# Patient Record
Sex: Female | Born: 1985 | Race: Black or African American | Hispanic: No | Marital: Single | Smoking: Current some day smoker
Health system: Southern US, Community
[De-identification: ages and names within clinical notes are randomized; demographics above are authoritative.]

## PROBLEM LIST (undated history)

## (undated) DIAGNOSIS — F329 Major depressive disorder, single episode, unspecified: Secondary | ICD-10-CM

## (undated) DIAGNOSIS — I219 Acute myocardial infarction, unspecified: Secondary | ICD-10-CM

## (undated) DIAGNOSIS — F32A Depression, unspecified: Secondary | ICD-10-CM

## (undated) DIAGNOSIS — I10 Essential (primary) hypertension: Secondary | ICD-10-CM

## (undated) DIAGNOSIS — E119 Type 2 diabetes mellitus without complications: Secondary | ICD-10-CM

## (undated) HISTORY — PX: APPENDECTOMY: SHX54

---
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
## 2006-12-02 ENCOUNTER — Ambulatory Visit: Payer: Self-pay | Admitting: Internal Medicine

## 2007-11-02 ENCOUNTER — Ambulatory Visit: Payer: Self-pay | Admitting: Internal Medicine

## 2007-11-02 DIAGNOSIS — R21 Rash and other nonspecific skin eruption: Secondary | ICD-10-CM

## 2011-09-02 ENCOUNTER — Encounter (HOSPITAL_COMMUNITY): Payer: Self-pay | Admitting: Emergency Medicine

## 2011-09-02 ENCOUNTER — Emergency Department (HOSPITAL_COMMUNITY)
Admission: EM | Admit: 2011-09-02 | Discharge: 2011-09-02 | Disposition: A | Discharge status: Home / Self Care | Payer: Self-pay | Attending: Emergency Medicine | Admitting: Emergency Medicine

## 2011-09-02 DIAGNOSIS — K047 Periapical abscess without sinus: Secondary | ICD-10-CM | POA: Insufficient documentation

## 2011-09-02 DIAGNOSIS — K0889 Other specified disorders of teeth and supporting structures: Secondary | ICD-10-CM

## 2011-09-02 MED ORDER — PENICILLIN V POTASSIUM 500 MG PO TABS: 500.0000 mg | ORAL_TABLET | Freq: Three times a day (TID) | ORAL | Status: AC

## 2011-09-02 MED ORDER — OXYCODONE-ACETAMINOPHEN 5-325 MG PO TABS: 2.0000 | ORAL_TABLET | ORAL | Status: AC | PRN

## 2011-09-02 NOTE — ED Provider Notes (Signed)
History     CSN: [id number]  Arrival date & time 09/02/11  1444   First MD Initiated Contact with Patient 09/02/11 1454      No chief complaint on file.   (Consider location/radiation/quality/duration/timing/severity/associated sxs/prior treatment) Patient is a 26 y.o. female presenting with tooth pain. The history is provided by the patient. No language interpreter was used.  Dental PainThe primary symptoms include mouth pain. Primary symptoms do not include oral bleeding, headaches, fever, shortness of breath or sore throat. The symptoms began more than 1 week ago. The symptoms are worsening. The symptoms are chronic. The symptoms occur constantly.  Additional symptoms include: gum swelling and gum tenderness. Additional symptoms do not include: purulent gums, trismus, jaw pain, facial swelling, trouble swallowing and ear pain. Medical issues do not include: alcohol problem, smoking, chewing tobacco, immunosuppression, periodontal disease and cancer.   Patient reports intermittent dental pain x2 weeks. States that there is some swelling and pain to the tooth decider wisdom tooth on the right top. States there is pain to her gum area on the lower right molar also. States that she was taking an exam today and needed to come to the ER because the pain was so bad. No past medical history for appendectomy.   History reviewed. No pertinent past medical history.  Past Surgical History  Procedure Date  . Appendectomy     No family history on file.  History  Substance Use Topics  . Smoking status: Never Smoker   . Smokeless tobacco: Not on file  . Alcohol Use: No    OB History    Grav Para Term Preterm Abortions TAB SAB Ect Mult Living                  Review of Systems  Unable to perform ROS Constitutional: Negative.  Negative for fever.  HENT: Positive for dental problem. Negative for ear pain, sore throat, facial swelling, trouble swallowing and voice change.   Eyes:  Negative.   Respiratory: Negative.  Negative for shortness of breath.   Cardiovascular: Negative.   Gastrointestinal: Negative.  Negative for nausea and vomiting.  Neurological: Negative.  Negative for headaches.  Psychiatric/Behavioral: Negative.   All other systems reviewed and are negative.    Allergies  Review of patient's allergies indicates no known allergies.  Home Medications  No current outpatient prescriptions on file.  BP 138/87  Pulse 92  Temp 98.8 F (37.1 C)  Resp 22  SpO2 99%  LMP 08/05/2011  Physical Exam  Nursing note and vitals reviewed. Constitutional: She is oriented to person, place, and time. She appears well-developed and well-nourished.  HENT:  Head: Normocephalic and atraumatic.  Mouth/Throat: Uvula is midline, oropharynx is clear and moist and mucous membranes are normal. Abnormal dentition. Dental abscesses present.    Eyes: Conjunctivae and EOM are normal. Pupils are equal, round, and reactive to light.  Neck: Normal range of motion. Neck supple.  Cardiovascular: Normal rate, regular rhythm, normal heart sounds and intact distal pulses.  Exam reveals no gallop and no friction rub.   No murmur heard. Pulmonary/Chest: Effort normal and breath sounds normal.  Abdominal: Soft. Bowel sounds are normal.  Musculoskeletal: Normal range of motion. She exhibits no edema and no tenderness.  Neurological: She is alert and oriented to person, place, and time. She has normal reflexes.  Skin: Skin is warm and dry.  Psychiatric: She has a normal mood and affect.    ED Course  Procedures (including critical care time)  Labs Reviewed - No data to display No results found.   No diagnosis found.    MDM  R upper molar and R lower Molar pain x 2 weeks with gum swelling to RU treated with rx for penicillin and percocet  With dental follow up tomorrow.  No fever or facial swelling.          Jethro Bastos, NP 09/03/11 [phone]

## 2011-09-02 NOTE — Discharge Instructions (Signed)
Ms Allcorn called the dentist on call for the ER listed below. Call within  24 hours and he will see you. Start antibiotics today. Take ibuprofen for pain with food. Take Percocet for severe pain do not drive with it. Turned to the ER for severe pain or any other medical concerns  Dental Pain Toothache is pain in or around a tooth. It may get worse with chewing or with cold or heat.  HOME CARE  Your dentist may use a numbing medicine during treatment. If so, you may need to avoid eating until the medicine wears off. Ask your dentist about this.   Only take medicine as told by your dentist or doctor.   Avoid chewing food near the painful tooth until after all treatment is done. Ask your dentist about this.  GET HELP RIGHT AWAY IF:   The problem gets worse or new problems appear.   You have a fever.   There is redness and puffiness (swelling) of the face, jaw, or neck.   You cannot open your mouth.   There is pain in the jaw.   There is very bad pain that is not helped by medicine.  MAKE SURE YOU:   Understand these instructions.   Will watch your condition.   Will get help right away if you are not doing well or get worse.  Document Released: 11/11/2007 Document Revised: 05/14/2011 Document Reviewed: 11/11/2007 [facility] Patient Information 2012 [place], [place].Dental Pain Toothache is pain in or around a tooth. It may get worse with chewing or with cold or heat.  HOME CARE  Your dentist may use a numbing medicine during treatment. If so, you may need to avoid eating until the medicine wears off. Ask your dentist about this.   Only take medicine as told by your dentist or doctor.   Avoid chewing food near the painful tooth until after all treatment is done. Ask your dentist about this.  GET HELP RIGHT AWAY IF:   The problem gets worse or new problems appear.   You have a fever.   There is redness and puffiness (swelling) of the face, jaw, or neck.   You cannot open your  mouth.   There is pain in the jaw.   There is very bad pain that is not helped by medicine.  MAKE SURE YOU:   Understand these instructions.   Will watch your condition.   Will get help right away if you are not doing well or get worse.  Document Released: 11/11/2007 Document Revised: 05/14/2011 Document Reviewed: 11/11/2007 [facility] Patient Information 2012 [place], [place].Dental Pain Toothache is pain in or around a tooth. It may get worse with chewing or with cold or heat.  HOME CARE  Your dentist may use a numbing medicine during treatment. If so, you may need to avoid eating until the medicine wears off. Ask your dentist about this.   Only take medicine as told by your dentist or doctor.   Avoid chewing food near the painful tooth until after all treatment is done. Ask your dentist about this.  GET HELP RIGHT AWAY IF:   The problem gets worse or new problems appear.   You have a fever.   There is redness and puffiness (swelling) of the face, jaw, or neck.   You cannot open your mouth.   There is pain in the jaw.   There is very bad pain that is not helped by medicine.  MAKE SURE YOU:   Understand these instructions.  Will watch your condition.   Will get help right away if you are not doing well or get worse.  Document Released: 11/11/2007 Document Revised: 05/14/2011 Document Reviewed: 11/11/2007 [facility] Patient Information 2012 [place], [place].Dental Care and Dentist Visits Dental care supports good overall health. Regular dental visits can also help you avoid dental pain, bleeding, infection, and other more serious health problems in the future. It is important to keep the mouth healthy because diseases in the teeth, gums, and other oral tissues can spread to other areas of the body. Some problems, such as diabetes, heart disease, and pre-term labor have been associated with poor oral health.  See your dentist every 6 months. If you experience emergency problems  such as a toothache or broken tooth, go to the dentist right away. If you see your dentist regularly, you may catch problems early. It is easier to be treated for problems in the early stages.  WHAT TO EXPECT AT A DENTIST VISIT  Your dentist will look for many common oral health problems and recommend proper treatment. At your regular dental visit, you can expect:  Gentle cleaning of the teeth and gums. This includes scraping and polishing. This helps to remove the sticky substance around the teeth and gums (plaque). Plaque forms in the mouth shortly after eating. Over time, plaque hardens on the teeth as tartar. If tartar is not removed regularly, it can cause problems. Cleaning also helps remove stains.   Periodic X-rays. These pictures of the teeth and supporting bone will help your dentist assess the health of your teeth.   Periodic fluoride treatments. Fluoride is a natural mineral shown to help strengthen teeth. Fluoride treatmentinvolves applying a fluoride gel or varnish to the teeth. It is most commonly done in children.   Examination of the mouth, tongue, jaws, teeth, and gums to look for any oral health problems, such as:   Cavities (dental caries). This is decay on the tooth caused by plaque, sugar, and acid in the mouth. It is best to catch a cavity when it is small.   Inflammation of the gums caused by plaque buildup (gingivitis).   Problems with the mouth or malformed or misaligned teeth.   Oral cancer or other diseases of the soft tissues or jaws.  KEEP YOUR TEETH AND GUMS HEALTHY For healthy teeth and gums, follow these general guidelines as well as your dentist's specific advice:  Have your teeth professionally cleaned at the dentist every 6 months.   Brush twice daily with a fluoride toothpaste.   Floss your teeth daily.   Ask your dentist if you need fluoride supplements, treatments, or fluoride toothpaste.   Eat a healthy diet. Reduce foods and drinks with added  sugar.   Avoid smoking.  TREATMENT FOR ORAL HEALTH PROBLEMS If you have oral health problems, treatment varies depending on the conditions present in your teeth and gums.  Your caregiver will most likely recommend good oral hygiene at each visit.   For cavities, gingivitis, or other oral health disease, your caregiver will perform a procedure to treat the problem. This is typically done at a separate appointment. Sometimes your caregiver will refer you to another dental specialist for specific tooth problems or for surgery.  SEEK IMMEDIATE DENTAL CARE IF:  You have pain, bleeding, or soreness in the gum, tooth, jaw, or mouth area.   A permanent tooth becomes loose or separated from the gum socket.   You experience a blow or injury to the mouth or jaw  area.  Document Released: 02/04/2011 Document Revised: 05/14/2011 Document Reviewed: 02/04/2011 [facility] Patient Information 2012 [place], [place].

## 2011-09-02 NOTE — ED Notes (Signed)
Pt reports that she has toothache to right upper teeth with swelling and pain 8/10. Denies fever.

## 2011-09-03 NOTE — ED Provider Notes (Signed)
Medical screening examination/treatment/procedure(s) were performed by non-physician practitioner and as supervising physician I was immediately available for consultation/collaboration.   Daytona Hedman, MD 09/03/11 1804 

## 2011-09-17 ENCOUNTER — Ambulatory Visit: Payer: Self-pay | Admitting: Family Medicine

## 2011-09-19 ENCOUNTER — Ambulatory Visit (INDEPENDENT_AMBULATORY_CARE_PROVIDER_SITE_OTHER): Payer: Self-pay | Admitting: Emergency Medicine

## 2011-09-19 VITALS — BP 121/80 | HR 76 | Temp 97.7°F | Resp 16 | Ht 63.0 in | Wt 174.0 lb

## 2011-09-19 DIAGNOSIS — Z139 Encounter for screening, unspecified: Secondary | ICD-10-CM

## 2011-09-19 NOTE — Progress Notes (Signed)
  Subjective:    Patient ID: Christy Rogers, female    DOB: 24-Jan-1986, 26 y.o.   MRN: [id number]  HPI    Review of Systems     Objective:   Physical Exam        Assessment & Plan:   PPD applied

## 2011-09-21 ENCOUNTER — Ambulatory Visit (INDEPENDENT_AMBULATORY_CARE_PROVIDER_SITE_OTHER): Payer: Self-pay

## 2011-09-21 DIAGNOSIS — Z139 Encounter for screening, unspecified: Secondary | ICD-10-CM

## 2011-09-21 LAB — TB SKIN TEST: Induration: 0

## 2012-01-04 ENCOUNTER — Emergency Department (HOSPITAL_COMMUNITY)
Admission: EM | Admit: 2012-01-04 | Discharge: 2012-01-04 | Disposition: A | Discharge status: Home / Self Care | Payer: Federal, State, Local not specified - PPO | Attending: Emergency Medicine | Admitting: Emergency Medicine

## 2012-01-04 ENCOUNTER — Encounter (HOSPITAL_COMMUNITY): Payer: Self-pay | Admitting: *Deleted

## 2012-01-04 ENCOUNTER — Emergency Department (HOSPITAL_COMMUNITY): Payer: Federal, State, Local not specified - PPO

## 2012-01-04 DIAGNOSIS — R079 Chest pain, unspecified: Secondary | ICD-10-CM | POA: Insufficient documentation

## 2012-01-04 DIAGNOSIS — Z9089 Acquired absence of other organs: Secondary | ICD-10-CM | POA: Insufficient documentation

## 2012-01-04 NOTE — ED Notes (Signed)
Pt reports left sided chest tightness with no associated symptoms.

## 2012-01-04 NOTE — ED Provider Notes (Signed)
History     CSN: [id number]  Arrival date & time 01/04/12  1845   First MD Initiated Contact with Patient 01/04/12 2037      Chief Complaint  Patient presents with  . Chest Pain    (Consider location/radiation/quality/duration/timing/severity/associated sxs/prior treatment) HPI  Patient presents to ER with concern of chest pain and high blood pressure. 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 Patient states that normally her blood pressure is approximately 130/95 but she states occassionally she will notice it higher which concerns her because she has family hx of high blood pressure on both sides of her family. Patient states that she noticed an elevated BP of 145/95 today and then had to get ready to leave school to go to work. Patient because concerned when she noticed an aching sensation in her left chest that was on and off from 6-7pm. patient states she has noticed this intermittently since starting school but states she had assumed it was stress related with school and job. She denies associated SOB, cough, fevers, chills, hemoptysis, n/v, diaphoresis. However patient states that the combination of discomfort in her chest plus elevated BP concerned her and that's why she presented to ED for evaluation. Patient denies any currently CP or complaint. She denies family hx of early heart diease or heart attack. She denies aggravating or alleviating factors. Patient states she took a 200mg  of ibuprofen when she first noticed the discomfort.   History reviewed. No pertinent past medical history.  Past Surgical History  Procedure Date  . Appendectomy     History reviewed. No pertinent family history.  History  Substance Use Topics  . Smoking status: Never Smoker   . Smokeless tobacco: Not on file  . Alcohol Use: No    OB History    Grav Para Term Preterm Abortions TAB SAB Ect Mult Living                   Review of Systems  All other systems reviewed and are negative.    Allergies  Review of patient's allergies indicates no known allergies.  Home Medications  No current outpatient prescriptions on file.  BP 140/98  Pulse 92  Temp 98.4 F (36.9 C) (Oral)  Resp 18  SpO2 99%  LMP 11/15/2011  Physical Exam  Nursing note and vitals reviewed. Constitutional: She is oriented to person, place, and time. She appears well-developed and well-nourished. No distress.  HENT:  Head: Normocephalic and atraumatic.  Eyes: Conjunctivae are normal.  Neck: Normal range of motion. Neck supple.  Cardiovascular: Normal rate, regular rhythm, normal heart sounds and intact distal pulses.  Exam reveals no gallop and no friction rub.   No murmur heard. Pulmonary/Chest: Effort normal and breath sounds normal. No respiratory distress. She has no wheezes. She has no rales. She exhibits no tenderness.  Abdominal: Bowel sounds are normal. She exhibits no distension and no mass. There is no tenderness. There is no rebound and no guarding.  Musculoskeletal: Normal range of motion. She exhibits no edema and no tenderness.  Neurological: She is alert and oriented to person, place, and time.  Skin: Skin is warm and dry. No rash noted. She is not diaphoretic. No erythema.  Psychiatric: She has a normal mood and affect.    ED Course  Procedures (including critical care time)  Labs Reviewed - No data to display Dg Chest 2 View  01/04/2012  *RADIOLOGY REPORT*  Clinical Data: Left-sided chest pain for 3 hours.  CHEST - 2 VIEW  Comparison: None.  Findings: Cardiomediastinal silhouette is within normal limits. The lungs are free of focal consolidations and pleural effusions. No pulmonary edema. Visualized osseous structures have a normal appearance.  IMPRESSION: Negative exam.  Original Report Authenticated By: Patterson Hammersmith, M.D.     1. Chest pain     Date: 01/04/2012  Rate: 97  Rhythm: normal sinus  rhythm with sinus arrhythmia.   QRS Axis: normal  Intervals: normal  ST/T Wave abnormalities: normal  Conduction Disutrbances: none  Narrative Interpretation:   Old EKG Reviewed: non provocative but non for comparison.       MDM  Patient is low risk for ACS and PE and is PERC negative. Question stress to be related to BP and chest pain but I spoke at length with patient about monitoring her BP daily and following up closely with a PCP for further evaluation and management of BP consistently greater than 130/90. Non provocative EKG and no concern for ACS in an otherwise young healthy 26 year old female. abdomen is soft and non tender.         [place], [place] 01/04/12 2052

## 2012-01-05 NOTE — ED Provider Notes (Signed)
Medical screening examination/treatment/procedure(s) were performed by non-physician practitioner and as supervising physician I was immediately available for consultation/collaboration.   Carleene Cooper III, MD 01/05/12 1146

## 2012-04-18 ENCOUNTER — Other Ambulatory Visit (INDEPENDENT_AMBULATORY_CARE_PROVIDER_SITE_OTHER): Payer: Federal, State, Local not specified - PPO

## 2012-04-18 ENCOUNTER — Encounter: Payer: Self-pay | Admitting: Internal Medicine

## 2012-04-18 ENCOUNTER — Ambulatory Visit (INDEPENDENT_AMBULATORY_CARE_PROVIDER_SITE_OTHER): Payer: Federal, State, Local not specified - PPO | Admitting: Internal Medicine

## 2012-04-18 VITALS — BP 122/70 | HR 81 | Temp 97.8°F | Resp 16 | Ht 63.0 in | Wt 160.0 lb

## 2012-04-18 DIAGNOSIS — F439 Reaction to severe stress, unspecified: Secondary | ICD-10-CM

## 2012-04-18 DIAGNOSIS — Z131 Encounter for screening for diabetes mellitus: Secondary | ICD-10-CM

## 2012-04-18 DIAGNOSIS — Z0000 Encounter for general adult medical examination without abnormal findings: Secondary | ICD-10-CM

## 2012-04-18 DIAGNOSIS — N926 Irregular menstruation, unspecified: Secondary | ICD-10-CM

## 2012-04-18 DIAGNOSIS — G4700 Insomnia, unspecified: Secondary | ICD-10-CM

## 2012-04-18 DIAGNOSIS — Z13220 Encounter for screening for lipoid disorders: Secondary | ICD-10-CM

## 2012-04-18 DIAGNOSIS — Z1329 Encounter for screening for other suspected endocrine disorder: Secondary | ICD-10-CM

## 2012-04-18 DIAGNOSIS — Z130 Encounter for screening for diseases of the blood and blood-forming organs and certain disorders involving the immune mechanism: Secondary | ICD-10-CM

## 2012-04-18 DIAGNOSIS — Z733 Stress, not elsewhere classified: Secondary | ICD-10-CM

## 2012-04-18 DIAGNOSIS — Z23 Encounter for immunization: Secondary | ICD-10-CM

## 2012-04-18 LAB — LIPID PANEL
Cholesterol: 155 mg/dL (ref 0–200)
HDL: 39.5 mg/dL (ref >39.00)
VLDL: 12 mg/dL (ref 0.0–40.0)

## 2012-04-18 LAB — TSH: TSH: 0.78 u[IU]/mL (ref 0.35–5.50)

## 2012-04-18 LAB — BASIC METABOLIC PANEL
BUN: 11 mg/dL (ref 6–23)
Creatinine, Ser: 0.7 mg/dL (ref 0.4–1.2)
GFR: 128.9 mL/min (ref >60.00)
Glucose, Bld: 236 mg/dL — ABNORMAL HIGH (ref 70–99)

## 2012-04-18 LAB — CBC
MCHC: 31.7 g/dL (ref 30.0–36.0)
MCV: 70.7 fl — ABNORMAL LOW (ref 78.0–100.0)
RBC: 5.53 Mil/uL — ABNORMAL HIGH (ref 3.87–5.11)
RDW: 14.9 % — ABNORMAL HIGH (ref 11.5–14.6)
WBC: 6.2 10*3/uL (ref 4.5–10.5)

## 2012-04-18 NOTE — Progress Notes (Signed)
Subjective:    Patient ID: Christy Rogers, female    DOB: Oct 26, 1985, 26 y.o.   MRN: [id number]  HPI  Pt presents to the clinic today to establish care. Pt was advised to come to the clinic today by her Investment banker, operational at [employer]. They were in lab class checking their own blood sugar, and Ms Epperly's blood sugar was 347. She states that she has never known that she has had high blood sugar before but is concerned because her mother is a Type I diabetic and her maternal grandparent have Type II diabetes. She does report some increased thirst and urination, but this is unchanged from since she was a teenager. She also has concerns of stress and insomnia. 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 The stress is intermittant but is moderately severe when she experiences it. The insomnia is constant and she usually only gets about 5 hours of sleep a night. She has never taken anything for stress or insomnia. Nothing really seems to make it better or worse. She also c/o of irregular menses that occurs every other month. They have been like that since the onset of menstruation at age 24. Her cycle usually last 6 days and is hevy to start with and gets progressively lighter. She does not c/o mood swings, bloating or back pain with her menses. She is not overly concerned about this.  Flu Vaccine: none HPV: none Pap smear: none LMP: 04/17/2012  Review of Systems      History reviewed. No pertinent past medical history.  No current outpatient prescriptions on file.    No Known Allergies  Family History  Problem Relation Age of Onset  . Hypertension Mother   . Diabetes Mother   . Hypertension Father   . Hypertension Maternal Grandmother   . Birth defects Maternal Grandfather   . Hypertension Maternal Grandfather   . Heart disease Paternal Grandmother   . Hypertension Paternal Grandmother   . Hypertension Paternal Grandfather      History   Social History  . Marital Status: Single    Spouse Name: N/A    Number of Children: N/A  . Years of Education: N/A   Occupational History  . Not on file.   Social History Main Topics  . Smoking status: Never Smoker   . Smokeless tobacco: Not on file  . Alcohol Use: 0.6 oz/week    1 Cans of beer per week  . Drug Use: No  . Sexually Active: No   Other Topics Concern  . Not on file   Social History Narrative  . No narrative on file     Constitutional:  Pt reports 20 lb intentional weight loss over the past 3 months. Denies fever, malaise, fatigue or headache.  HEENT: Denies eye pain, eye redness, ear pain, ringing in the ears, wax buildup, runny nose, nasal congestion, bloody nose, or sore throat. Respiratory: Denies difficulty breathing, shortness of breath, cough or sputum production.   Cardiovascular: Denies chest pain, chest tightness, palpitations or swelling in the hands or feet.  Gastrointestinal: Pt reports increased thirst. Denies abdominal pain, bloating, constipation, diarrhea or blood in the stool.  GU:  Pt reports moe frequent urination. Denies urgency, pain with urination, burning sensation, blood in urine, odor or discharge. Musculoskeletal: Denies decrease in range of motion, difficulty with gait, muscle pain or joint pain and swelling.  Skin: Denies redness, rashes, lesions or ulcercations.  Neurological:  Denies dizziness, difficulty with memory, difficulty with speech or problems with balance and coordination.  Psych: Pt does report increased stress and insomnia, denies SI/HI.  No other specific complaints in a complete review of systems (except as listed in HPI above).  Objective:   Physical Exam  BP 122/70  Pulse 81  Temp 97.8 F (36.6 C) (Oral)  Resp 16  Ht 5\' 3"  (1.6 m)  Wt 160 lb (72.576 kg)  BMI 28.34 kg/m2  SpO2 98%  LMP 04/17/2012 Wt Readings from Last 3 Encounters:  04/18/12 160 lb (72.576 kg)  09/19/11 174 lb (78.926 kg)   11/02/07 176 lb (79.833 kg)    General: Appears her stated age, well developed, well nourished in NAD. Skin: Warm, dry and intact. No rashes, lesions or ulcerations noted. HEENT: Head: normal shape and size; Eyes: sclera white, no icterus, conjunctiva pink, PERRLA and EOMs intact; Ears: Tm's gray and intact, normal light reflex; Nose: mucosa pink and moist, septum midline; Throat/Mouth: Teeth present, mucosa pink and moist, no exudate, lesions or ulcerations noted.  Neck: Normal range of motion. Neck supple, trachea midline. No massses, lumps or thyromegaly present.  Cardiovascular: Normal rate and rhythm. S1,S2 noted.  No murmur, rubs or gallops noted. No JVD or BLE edema. No carotid bruits noted. Pulmonary/Chest: Normal effort and positive vesicular breath sounds. No respiratory distress. No wheezes, rales or ronchi noted.  Abdomen: Soft and nontender. Normal bowel sounds, no bruits noted. No distention or masses noted. Liver, spleen and kidneys non palpable. Musculoskeletal: Normal range of motion. No signs of joint swelling. No difficulty with gait.  Neurological: Alert and oriented. Cranial nerves II-XII intact. Coordination normal. +DTRs bilaterally. Psychiatric: Mood slightly anxious and affect normal. Behavior is normal. Judgment and thought content normal.    Assessment & Plan:   Preventative Health Maintenance visit:  Will obtain basic labs: lipid profile, CBC Flu vaccine given today HPV vaccine given today Pt will call back to schedule pap smear  Irregular Menses, chronic problem with additional workup required:  Will obtain CBC, TSH  Poldypsia/Polyuria, new problem with additional workup required, family hx of diabetes type 1  Will obtain BMET, HgBA1C  Stress and Insomnia:  Talked to patient about relaxation techniques Will think about taking a low does SSRI for increased stress, not ready to at this time.  RTC in 1 year for annual wellness visit.

## 2012-04-18 NOTE — Addendum Note (Signed)
Addended by: Lorre Munroe on: 04/18/2012 10:20 AM   Modules accepted: Level of Service

## 2012-04-18 NOTE — Patient Instructions (Signed)
Health Maintenance, Females A healthy lifestyle and preventative care can promote health and wellness.  Maintain regular health, dental, and eye exams.  Eat a healthy diet. Foods like vegetables, fruits, whole grains, low-fat dairy products, and lean protein foods contain the nutrients you need without too many calories. Decrease your intake of foods high in solid fats, added sugars, and salt. Get information about a proper diet from your caregiver, if necessary.  Regular physical exercise is one of the most important things you can do for your health. Most adults should get at least 150 minutes of moderate-intensity exercise (any activity that increases your heart rate and causes you to sweat) each week. In addition, most adults need muscle-strengthening exercises on 2 or more days a week.   Maintain a healthy weight. The body mass index (BMI) is a screening tool to identify possible weight problems. It provides an estimate of body fat based on height and weight. Your caregiver can help determine your BMI, and can help you achieve or maintain a healthy weight. For adults 20 years and older:  A BMI below 18.5 is considered underweight.  A BMI of 18.5 to 24.9 is normal.  A BMI of 25 to 29.9 is considered overweight.  A BMI of 30 and above is considered obese.  Maintain normal blood lipids and cholesterol by exercising and minimizing your intake of saturated fat. Eat a balanced diet with plenty of fruits and vegetables. Blood tests for lipids and cholesterol should begin at age 20 and be repeated every 5 years. If your lipid or cholesterol levels are high, you are over 50, or you are a high risk for heart disease, you may need your cholesterol levels checked more frequently.Ongoing high lipid and cholesterol levels should be treated with medicines if diet and exercise are not effective.  If you smoke, find out from your caregiver how to quit. If you do not use tobacco, do not start.  If you  are pregnant, do not drink alcohol. If you are breastfeeding, be very cautious about drinking alcohol. If you are not pregnant and choose to drink alcohol, do not exceed 1 drink per day. One drink is considered to be 12 ounces (355 mL) of beer, 5 ounces (148 mL) of wine, or 1.5 ounces (44 mL) of liquor.  Avoid use of street drugs. Do not share needles with anyone. Ask for help if you need support or instructions about stopping the use of drugs.  High blood pressure causes heart disease and increases the risk of stroke. Blood pressure should be checked at least every 1 to 2 years. Ongoing high blood pressure should be treated with medicines, if weight loss and exercise are not effective.  If you are 55 to 25 years old, ask your caregiver if you should take aspirin to prevent strokes.  Diabetes screening involves taking a blood sample to check your fasting blood sugar level. This should be done once every 3 years, after age 45, if you are within normal weight and without risk factors for diabetes. Testing should be considered at a younger age or be carried out more frequently if you are overweight and have at least 1 risk factor for diabetes.  Breast cancer screening is essential preventative care for women. You should practice "breast self-awareness." This means understanding the normal appearance and feel of your breasts and may include breast self-examination. Any changes detected, no matter how small, should be reported to a caregiver. Women in their 20s and 30s should have   a clinical breast exam (CBE) by a caregiver as part of a regular health exam every 1 to 3 years. After age 40, women should have a CBE every year. Starting at age 40, women should consider having a mammogram (breast X-ray) every year. Women who have a family history of breast cancer should talk to their caregiver about genetic screening. Women at a high risk of breast cancer should talk to their caregiver about having an MRI and a  mammogram every year.  The Pap test is a screening test for cervical cancer. Women should have a Pap test starting at age 21. Between ages 21 and 29, Pap tests should be repeated every 2 years. Beginning at age 30, you should have a Pap test every 3 years as long as the past 3 Pap tests have been normal. If you had a hysterectomy for a problem that was not cancer or a condition that could lead to cancer, then you no longer need Pap tests. If you are between ages 65 and 70, and you have had normal Pap tests going back 10 years, you no longer need Pap tests. If you have had past treatment for cervical cancer or a condition that could lead to cancer, you need Pap tests and screening for cancer for at least 20 years after your treatment. If Pap tests have been discontinued, risk factors (such as a new sexual partner) need to be reassessed to determine if screening should be resumed. Some women have medical problems that increase the chance of getting cervical cancer. In these cases, your caregiver may recommend more frequent screening and Pap tests.  The human papillomavirus (HPV) test is an additional test that may be used for cervical cancer screening. The HPV test looks for the virus that can cause the cell changes on the cervix. The cells collected during the Pap test can be tested for HPV. The HPV test could be used to screen women aged 30 years and older, and should be used in women of any age who have unclear Pap test results. After the age of 30, women should have HPV testing at the same frequency as a Pap test.  Colorectal cancer can be detected and often prevented. Most routine colorectal cancer screening begins at the age of 50 and continues through age 75. However, your caregiver may recommend screening at an earlier age if you have risk factors for colon cancer. On a yearly basis, your caregiver may provide home test kits to check for hidden blood in the stool. Use of a small camera at the end of a  tube, to directly examine the colon (sigmoidoscopy or colonoscopy), can detect the earliest forms of colorectal cancer. Talk to your caregiver about this at age 50, when routine screening begins. Direct examination of the colon should be repeated every 5 to 10 years through age 75, unless early forms of pre-cancerous polyps or small growths are found.  Hepatitis C blood testing is recommended for all people born from 1945 through 1965 and any individual with known risks for hepatitis C.  Practice safe sex. Use condoms and avoid high-risk sexual practices to reduce the spread of sexually transmitted infections (STIs). Sexually active women aged 25 and younger should be checked for Chlamydia, which is a common sexually transmitted infection. Older women with new or multiple partners should also be tested for Chlamydia. Testing for other STIs is recommended if you are sexually active and at increased risk.  Osteoporosis is a disease in which the   bones lose minerals and strength with aging. This can result in serious bone fractures. The risk of osteoporosis can be identified using a bone density scan. Women ages 65 and over and women at risk for fractures or osteoporosis should discuss screening with their caregivers. Ask your caregiver whether you should be taking a calcium supplement or vitamin D to reduce the rate of osteoporosis.  Menopause can be associated with physical symptoms and risks. Hormone replacement therapy is available to decrease symptoms and risks. You should talk to your caregiver about whether hormone replacement therapy is right for you.  Use sunscreen with a sun protection factor (SPF) of 30 or greater. Apply sunscreen liberally and repeatedly throughout the day. You should seek shade when your shadow is shorter than you. Protect yourself by wearing long sleeves, pants, a wide-brimmed hat, and sunglasses year round, whenever you are outdoors.  Notify your caregiver of new moles or  changes in moles, especially if there is a change in shape or color. Also notify your caregiver if a mole is larger than the size of a pencil eraser.  Stay current with your immunizations. Document Released: 12/08/2010 Document Revised: 08/17/2011 Document Reviewed: 12/08/2010 [employer] Patient Information 2013 [employer]. Breast Self-Exam A self breast exam may help you find changes or problems while they are still small. Do a breast self-exam:  Every month.  One week after your period (menstrual period).  On the first day of each month if you do not have periods anymore. Look for any:  Change in breast color, size, or shape.  Dimples in your breast.  Changes in your nipples or skin.  Dry skin on your breasts or nipples.  Watery or bloody discharge from your nipples.  Feel for:  Lumps.  Thick, hard places.  Any other changes. HOME CARE There are 3 ways to do the breast self-exam: In front of a mirror.  Lift your arms over your head and turn side to side.  Put your hands on your hips and lean down, then turn from side to side.  Bend forward and turn from side to side. In the shower.  With soapy hands, check both breasts. Then check above and below your collarbone and your armpits.  Feel above and below your collarbone down to under your breast, and from the center of your chest to the outer edge of the armpit. Check for any lumps or hard spots.  Using the tips of your middle three fingers check your whole breast by pressing your hand over your breast in a circle or in an up and down motion. Lying down.  Lie flat on your bed.  Put a small pillow under the breast you are going to check. On that same side, put your hand behind your head.  With your other hand, use the 3 middle fingers to feel the breast.  Move your fingers in a circle around the breast. Press firmly over all parts of the breast to feel for any lumps. GET HELP RIGHT AWAY IF: You find any  changes in your breasts so they can be checked. Document Released: 11/11/2007 Document Revised: 08/17/2011 Document Reviewed: 09/12/2008 [employer] Patient Information 2013 [employer].  

## 2012-04-22 ENCOUNTER — Ambulatory Visit (INDEPENDENT_AMBULATORY_CARE_PROVIDER_SITE_OTHER): Payer: Federal, State, Local not specified - PPO | Admitting: Internal Medicine

## 2012-04-22 ENCOUNTER — Other Ambulatory Visit: Payer: Federal, State, Local not specified - PPO

## 2012-04-22 ENCOUNTER — Encounter: Payer: Self-pay | Admitting: Internal Medicine

## 2012-04-22 VITALS — BP 102/78 | HR 89 | Temp 98.6°F | Ht 63.0 in | Wt 161.0 lb

## 2012-04-22 DIAGNOSIS — E109 Type 1 diabetes mellitus without complications: Secondary | ICD-10-CM

## 2012-04-22 DIAGNOSIS — Z23 Encounter for immunization: Secondary | ICD-10-CM

## 2012-04-22 LAB — MICROALBUMIN / CREATININE URINE RATIO: Creatinine,U: 239 mg/dL

## 2012-04-22 MED ORDER — INSULIN PEN NEEDLE 32G X 4 MM MISC: Status: DC

## 2012-04-22 MED ORDER — INSULIN REGULAR HUMAN 100 UNIT/ML IJ SOLN: INTRAMUSCULAR | Status: DC

## 2012-04-22 MED ORDER — INSULIN GLARGINE 100 UNIT/ML ~~LOC~~ SOLN: 14.0000 [IU] | Freq: Every day | SUBCUTANEOUS | Status: DC

## 2012-04-22 MED ORDER — "INSULIN SYRINGE 29G X 1/2"" 0.5 ML MISC": Status: DC

## 2012-04-22 MED ORDER — ONETOUCH DELICA LANCETS MISC: Status: DC

## 2012-04-22 MED ORDER — GLUCOSE BLOOD VI STRP: ORAL_STRIP | Status: DC

## 2012-04-22 NOTE — Patient Instructions (Addendum)
Diabetes and Exercise Regular exercise is important and can help:    Control blood glucose (sugar).   Decrease blood pressure.     Control blood lipids (cholesterol, triglycerides).   Improve overall health.  BENEFITS FROM EXERCISE  Improved fitness.   Improved flexibility.   Improved endurance.   Increased bone density.   Weight control.   Increased muscle strength.   Decreased body fat.   Improvement of the body's use of insulin, a hormone.   Increased insulin sensitivity.   Reduction of insulin needs.   Reduced stress and tension.   Helps you feel better.  People with diabetes who add exercise to their lifestyle gain additional benefits, including:  Weight loss.   Reduced appetite.   Improvement of the body's use of blood glucose.   Decreased risk factors for heart disease:   Lowering of cholesterol and triglycerides.   Raising the level of good cholesterol (high-density lipoproteins, HDL).   Lowering blood sugar.   Decreased blood pressure.  TYPE 1 DIABETES AND EXERCISE  Exercise will usually lower your blood glucose.   If blood glucose is greater than 240 mg/dl, check urine ketones. If ketones are present, do not exercise.   Location of the insulin injection sites may need to be adjusted with exercise. Avoid injecting insulin into areas of the body that will be exercised. For example, avoid injecting insulin into:   The arms when playing tennis.   The legs when jogging. For more information, discuss this with your caregiver.   Keep a record of:   Food intake.   Type and amount of exercise.   Expected peak times of insulin action.   Blood glucose levels.  Do this before, during, and after exercise. Review your records with your caregiver. This will help you to develop guidelines for adjusting food intake and insulin amounts.   TYPE 2 DIABETES AND EXERCISE  Regular physical activity can help control blood glucose.   Exercise is  important because it may:   Increase the body's sensitivity to insulin.   Improve blood glucose control.   Exercise reduces the risk of heart disease. It decreases serum cholesterol and triglycerides. It also lowers blood pressure.   Those who take insulin or oral hypoglycemic agents should watch for signs of hypoglycemia. These signs include dizziness, shaking, sweating, chills, and confusion.   Body water is lost during exercise. It must be replaced. This will help to avoid loss of body fluids (dehydration) or heat stroke.  Be sure to talk to your caregiver before starting an exercise program to make sure it is safe for you. Remember, any activity is better than none.   Document Released: 08/15/2003 Document Revised: 08/17/2011 Document Reviewed: 11/29/2008 [facility] Patient Information 2013 [place], [place].   Diabetes and Sick Day Management Blood sugar (glucose) can be more difficult to control when you are sick. Colds, fever, flu, nausea, vomiting, and diarrhea are all examples of common illnesses that can cause problems for people with diabetes. Loss of body fluids (dehydration) from fever, vomiting, diarrhea, infection, and the stress of a sickness can all cause blood glucose levels to rise. Because of this, it is very important to take your diabetes medicines and to eat some form of carbohydrate food when you are sick. Liquid or soft foods are often tolerated, and they help to replace fluids. HOME CARE INSTRUCTIONS These main guidelines are intended for managing a short-term (24 hours or less) sickness:  Take your usual dose of insulin or oral diabetes  medicine. An exception would be if you take any form of metformin. If you cannot eat or drink, you can become dehydrated and should not take this medicine.   Continue to take your insulin even if you are unable to eat solid foods or are vomiting. Your insulin dose may stay the same, or it may need to be increased when you are sick.   You  will need to test your blood glucose more often, generally every 2 to 4 hours. If you have type 1 diabetes, test your urine for ketones every 4 hours. If you have type 2 diabetes, test your ketones as directed by your caregiver.   Eat some form of food that contains carbohydrates. The carbohydrates can be in solid or liquid form. You should eat 45 to 50 grams of carbohydrates every 3 to 4 hours.   Replace fluids if fever, vomiting, or diarrhea is present. Ask your caregiver for specific rehydration instructions.   Watch carefully for the signs of ketoacidosis if you have type 1 diabetes. Call your caregiver if any of the following symptoms are present, especially in children:   Moderate to large ketones in the urine along with a high blood glucose level.   Severe nausea.   Vomiting.   Diarrhea.   Abdominal pain.   Rapid breathing.   Drink extra liquids that do not contain sugar, such as water or sugar-free liquids, if your blood glucose is higher than 240 mg/dl.   Be careful with over-the-counter medicines. Read the labels. They may contain sugar or types of sugars that can raise your blood glucose.  Food Choices for Illness All of the food choices below contain around 15 grams of carbohydrates. Plan ahead and keep some of these foods around.     to  cup carbonated beverage containing sugar. Carbonated beverages will usually be better tolerated if they are opened and left at room temperature for a few minutes.    of a twin frozen ice pop.    cup sweetened gelatin dessert.    cup juice.    cup ice cream or frozen yogurt.    cup cooked cereal.    cup sherbet.   1 cup broth-based soup with noodles or rice, reconstituted with water.   1 cup cream soup.    cup regular custard.    cup regular pudding.   1 cup sports drink.   1 cup plain yogurt.   1 slice toast.   6 squares saltine crackers.   5 vanilla wafers.  SEEK MEDICAL CARE IF:    You are sick and  see no improvement in 6 to 8 hours.   You are unable to eat regular foods for more than 24 hours.   You have blood glucose readings over 240 mg/dl, 2 times in a row.   Your blood glucose is less than 70 mg/dl, 2 times in a row.   You are not sure what to do to take care of yourself.   You vomit more than once in 4 to 6 hours.   You have moderate or large ketones in your urine.   You have a fever.   Your symptoms are getting worse even though you are following your caregiver's instructions.  Document Released: 05/28/2003 Document Revised: 08/17/2011 Document Reviewed: 12/02/2010 [facility] Patient Information 2013 [place], [place].   Diabetes and Standards of Medical Care   Diabetes is complicated. You may find that your diabetes team includes a dietitian, nurse, diabetes educator, eye doctor, and  more. To help everyone know what is going on and to help you get the care you deserve, the following schedule of care was developed to help keep you on track. Below are the tests, exams, vaccines, medicines, education, and plans you will need. A1c test  Performed at least 2 times a year if you are meeting treatment goals.   Performed 4 times a year if therapy has changed or if you are not meeting therapy/glycemic goals.  Aspirin medicine  Take daily as directed by your caregiver.  Blood pressure test  Performed at every routine medical visit. The goal is less than 130/80 mm/Hg.  Dental exam  Get a dental exam at least 2 times a year.  Dilated eye exam (retinal exam)  Type 1 diabetes: Get an exam within 5 years of diagnosis and then yearly.   Type 2 diabetes: Get an exam at diagnosis and then yearly.  All exams thereafter can be extended to every 2 to 3 years if one or more exams have been normal. Foot care exam  Visual foot exams are performed at every routine medical visit. The exams check for cuts, injuries, or other problems with the feet.   A comprehensive foot exam should be  done yearly. This includes visual inspection as well as assessing foot pulses and testing for loss of sensation.  Kidney function test (urine microalbumin)  Performed once a year.   Type 1 diabetes: The first test is performed 5 years after diagnosis.   Type 2 diabetes: The first test is performed at the time of diagnosis.   A serum creatinine and estimated glomerular filtration rate (eGFR) test is done once a year to tell the level of chronic kidney disease (CKD), if present.  Lipid profile (Cholesterol, HDL, LDL, Triglycerides)  Performed once a year for most people. If at low risk, may be assessed every 2 years.   The goal for LDL is less than 100 mg/dl. If at high risk, the goal is less than 70 mg/dl.   The goal for HDL is higher than 40 mg/dl for men and higher than 50 mg/dl for women.   The goal for triglycerides is less than 150 mg/dl.  Flu vaccine, pneumonia vaccine, and hepatitis B vaccine  The flu vaccine is recommended yearly.   The pneumonia vaccine is generally given once in a lifetime. However, there are some instances where another vaccine is recommended. Check with your caregiver.   The hepatitis B vaccine is also recommended for adults with diabetes.  Diabetes self-management education  Recommended at diagnosis and ongoing as needed.  Treatment plan  Reviewed at every medical visit.  Document Released: 03/22/2009 Document Revised: 08/17/2011 Document Reviewed: 11/25/2010 [facility] Patient Information 2013 [place], [place].   Diabetes Meal Planning Guide The diabetes meal planning guide is a tool to help you plan your meals and snacks. It is important for people with diabetes to manage their blood glucose (sugar) levels. Choosing the right foods and the right amounts throughout your day will help control your blood glucose. Eating right can even help you improve your blood pressure and reach or maintain a healthy weight. CARBOHYDRATE COUNTING MADE EASY When you eat  carbohydrates, they turn to sugar. This raises your blood glucose level. Counting carbohydrates can help you control this level so you feel better. When you plan your meals by counting carbohydrates, you can have more flexibility in what you eat and balance your medicine with your food intake. Carbohydrate counting simply means adding up  the total amount of carbohydrate grams in your meals and snacks. Try to eat about the same amount at each meal. Foods with carbohydrates are listed below. Each portion below is 1 carbohydrate serving or 15 grams of carbohydrates. Ask your dietician how many grams of carbohydrates you should eat at each meal or snack. Grains and Starches  1 slice bread.    English muffin or hotdog/hamburger bun.    cup cold cereal (unsweetened).   cup cooked pasta or rice.    cup starchy vegetables (corn, potatoes, peas, beans, winter squash).   1 tortilla (6 inches).    bagel.   1 waffle or pancake (size of a CD).    cup cooked cereal.   4 to 6 small crackers.  *Whole grain is recommended. Fruit  1 cup fresh unsweetened berries, melon, papaya, pineapple.   1 small fresh fruit.    banana or mango.    cup fruit juice (4 oz unsweetened).    cup canned fruit in natural juice or water.   2 tbs dried fruit.   12 to 15 grapes or cherries.  Milk and Yogurt  1 cup fat-free or 1% milk.   1 cup soy milk.   6 oz light yogurt with sugar-free sweetener.   6 oz low-fat soy yogurt.   6 oz plain yogurt.  Vegetables  1 cup raw or  cup cooked is counted as 0 carbohydrates or a "free" food.   If you eat 3 or more servings at 1 meal, count them as 1 carbohydrate serving.  Other Carbohydrates   oz chips or pretzels.    cup ice cream or frozen yogurt.    cup sherbet or sorbet.   2 inch square cake, no frosting.   1 tbs honey, sugar, jam, jelly, or syrup.   2 small cookies.   3 squares of graham crackers.   3 cups popcorn.   6 crackers.     1 cup broth-based soup.   Count 1 cup casserole or other mixed foods as 2 carbohydrate servings.   Foods with less than 20 calories in a serving may be counted as 0 carbohydrates or a "free" food.  You may want to purchase a book or computer software that lists the carbohydrate gram counts of different foods. In addition, the nutrition facts panel on the labels of the foods you eat are a good source of this information. The label will tell you how big the serving size is and the total number of carbohydrate grams you will be eating per serving. Divide this number by 15 to obtain the number of carbohydrate servings in a portion. Remember, 1 carbohydrate serving equals 15 grams of carbohydrate. SERVING SIZES Measuring foods and serving sizes helps you make sure you are getting the right amount of food. The list below tells how big or small some common serving sizes are.  1 oz.........4 stacked dice.   3 oz........Mar[place] KitchenDeck of cards.   1 tsp.......Mar[place] KitchenTip of little finger.   1 tbs......Marland KitchenMarland KitchenThumb.   2 tbs.......Marland KitchenGolf ball.    cup......Marland KitchenHalf of a fist.   1 cup.......Marland KitchenA fist.  SAMPLE DIABETES MEAL PLAN Below is a sample meal plan that includes foods from the grain and starches, dairy, vegetable, fruit, and meat groups. A dietician can individualize a meal plan to fit your calorie needs and tell you the number of servings needed from each food group. However, controlling the total amount of carbohydrates in your meal or snack is more important than making sure  you include all of the food groups at every meal. You may interchange carbohydrate containing foods (dairy, starches, and fruits). The meal plan below is an example of a 2000 calorie diet using carbohydrate counting. This meal plan has 17 carbohydrate servings. Breakfast  1 cup oatmeal (2 carb servings).    cup light yogurt (1 carb serving).   1 cup blueberries (1 carb serving).    cup almonds.  Snack  1 large apple (2 carb  servings).   1 low-fat string cheese stick.  Lunch  Chicken breast salad.   1 cup spinach.    cup chopped tomatoes.   2 oz chicken breast, sliced.   2 tbs low-fat [place] dressing.   12 whole-wheat crackers (2 carb servings).   12 to 15 grapes (1 carb serving).   1 cup low-fat milk (1 carb serving).  Snack  1 cup carrots.    cup hummus (1 carb serving).  Dinner  3 oz broiled salmon.   1 cup brown rice (3 carb servings).  Snack  1  cups steamed broccoli (1 carb serving) drizzled with 1 tsp olive oil and lemon juice.   1 cup light pudding (2 carb servings).  DIABETES MEAL PLANNING WORKSHEET Your dietician can use this worksheet to help you decide how many servings of foods and what types of foods are right for you.   BREAKFAST Food Group and Servings / Carb Servings Grain/Starches __________________________________ Dairy __________________________________________ Vegetable ______________________________________ Fruit ___________________________________________ Meat __________________________________________ Fat ____________________________________________ LUNCH Food Group and Servings / Carb Servings Grain/Starches ___________________________________ Dairy ___________________________________________ Fruit ____________________________________________ Meat ___________________________________________ Fat _____________________________________________ Laural Golden Food Group and Servings / Carb Servings Grain/Starches ___________________________________ Dairy ___________________________________________ Fruit ____________________________________________ Meat ___________________________________________ Fat _____________________________________________ SNACKS Food Group and Servings / Carb Servings Grain/Starches ___________________________________ Dairy ___________________________________________ Vegetable _______________________________________ Fruit  ____________________________________________ Meat ___________________________________________ Fat _____________________________________________ DAILY TOTALS Starches _________________________ Vegetable ________________________ Fruit ____________________________ Dairy ____________________________ Meat ____________________________ Fat ______________________________ Document Released: 02/19/2005 Document Revised: 08/17/2011 Document Reviewed: 12/31/2008 [employer] Patient Information 2013 [place], [place].   Diabetes, Frequently Asked Questions WHAT IS DIABETES? Most of the food we eat is turned into glucose (sugar). Our bodies use it for energy. The pancreas makes a hormone called insulin. It helps glucose get into the cells of our bodies. When you have diabetes, your body either does not make enough insulin or cannot use its own insulin as well as it should. This causes sugars to build up in your blood. WHAT ARE THE SYMPTOMS OF DIABETES?  Frequent urination.   Excessive thirst.   Unexplained weight loss.   Extreme hunger.   Blurred vision.   Tingling or numbness in hands or feet.   Feeling very tired much of the time.   Dry, itchy skin.   Sores that are slow to heal.   Yeast infections.  WHAT ARE THE TYPES OF DIABETES? Type 1 Diabetes   About 10% of affected people have this type.   Usually occurs before the age of 23.   Usually occurs in thin to normal weight people.  Type 2 Diabetes  About 90% of affected people have this type.   Usually occurs after the age of 17.   Usually occurs in overweight people.   More likely to have:   A family history of diabetes.   A history of diabetes during pregnancy (gestational diabetes).   High blood pressure.   High cholesterol and triglycerides.  Gestational Diabetes  Occurs in about 4% of pregnancies.   Usually goes away after the baby is born.  More likely to occur in women with:   Family history of diabetes.     Previous gestational diabetes.   Obese.   Over 60 years old.  WHAT IS PRE-DIABETES? Pre-diabetes means your blood glucose is higher than normal, but lower than the diabetes range. It also means you are at risk of getting type 2 diabetes and heart disease. If you are told you have pre-diabetes, have your blood glucose checked again in 1 to 2 years. WHAT IS THE TREATMENT FOR DIABETES? Treatment is aimed at keeping blood glucose near normal levels at all times. Learning how to manage this yourself is important in treating diabetes. Depending on the type of diabetes you have, your treatment will include one or more of the following:  Monitoring your blood glucose.   Meal planning.   Exercise.   Oral medicine (pills) or insulin.  CAN DIABETES BE PREVENTED? With type 1 diabetes, prevention is more difficult, because the triggers that cause it are not yet known. With type 2 diabetes, prevention is more likely, with lifestyle changes:  Maintain a healthy weight.   Eat healthy.   Exercise.  IS THERE A CURE FOR DIABETES? No, there is no cure for diabetes. There is a lot of research going on that is looking for a cure, and progress is being made. Diabetes can be treated and controlled. People with diabetes can manage their diabetes and lead normal, active lives. SHOULD I BE TESTED FOR DIABETES? If you are at least 25 years old, you should be tested for diabetes. You should be tested again every 3 years. If you are 45 or older and overweight, you may want to get tested more often. If you are younger than 45, overweight, and have one or more of the following risk factors, you should be tested:  Family history of diabetes.   Inactive lifestyle.   High blood pressure.  WHAT ARE SOME OTHER SOURCES FOR INFORMATION ON DIABETES? The following organizations may help in your search for more information on diabetes: National Diabetes Education Program (NDEP) Internet:  SolarDiscussions.es American Diabetes Association Internet: http://www.diabetes.org   Juvenile Diabetes Foundation International Internet: WetlessWash.is Document Released: 05/28/2003 Document Revised: 08/17/2011 Document Reviewed: 03/22/2009 [facility] Patient Information 2013 [place], [place].   Diabetes, Type 1 Diabetes is a long-term (chronic) disease. It occurs when the cells in the pancreas that make insulin (a hormone) are destroyed and can no longer make insulin. Type 1 diabetes was also previously called juvenile-onset diabetes. It most often occurs before the age of 27, but it can also occur in older people. CAUSES   Among other factors, the following may cause type 1 diabetes:  Genetics. This means it may be passed to you by your parents.   The beta cells that make insulin are destroyed. The cause of this is unknown.  SYMPTOMS    Urinating more than usual (or bed-wetting in children).   Drinking more than usual.   Irritability.   Feeling very hungry.   Weight loss (may be rapid).   Nausea and vomiting.   Abdominal pain.   Feeling more tired than usual (fatigue).   Rapid breathing.   Difficulty staying awake.   Night sweats.  DIAGNOSIS   Your blood is tested to determine whether you have type 1 diabetes. TREATMENT    You will need to check your blood glucose (sugar) levels several times a day.   You will need to balance insulin, a healthy meal plan, and exercise to maintain normal blood glucose.  Education and ongoing support is recommended.   You should have regular checkups and immunizations.  HOME CARE INSTRUCTIONS    Never run out of insulin. It is needed every day.   Do not skip insulin doses.   Do not skip meals. Eat healthy.   Follow your treatment and monitoring plan.   Wear a pendant or bracelet stating you have diabetes and take insulin.   If you start a new exercise or sport, watch for low blood glucose (hypoglycemia)  symptoms. Insulin dosing may need to be adjusted.   Follow up with your caregiver regularly.   Tell your workplace or school about your diabetes treatment plan.  SEEK MEDICAL CARE IF:    You have problems keeping your blood glucose in target range.   You have blood glucose readings that are often too high or too low.   You have problems with your medicines.   You have symptoms of an illness that do not improve after 24 hours.   You have a sore or wound that is not healing.   You notice a change in vision or a new problem with your vision.   You have a fever.  MAKE SURE YOU:  Understand these instructions.   Will watch your condition.   Will get help right away if you are not doing well or get worse.  Document Released: 05/22/2000 Document Revised: 08/17/2011 Document Reviewed: 11/10/2010 [facility] Patient Information 2013 [place], [place].   Insulin Treatment in Diabetes Diabetes is a lasting (chronic) disease. It occurs when the body does not properly use the sugar (glucose) that is released from food. Glucose levels are controlled by a hormone called insulin, which is made by your pancreas. Depending on the type of diabetes you have, either:  The pancreas does not make any insulin (type 1 diabetes).   The pancreas makes too little insulin, and the body cannot respond normally to the insulin that is made (type 2 diabetes).  Without insulin, death can occur. Diabetes requires lifelong monitoring and treatment. This document will discuss the role of insulin in your treatment and provide information about insulin.   LIFESTYLE CHOICES Lifestyle choices can affect both the control of your diabetes and your risk of complications from diabetes. Lifestyle choices are critically important in the overall management of diabetes. They are important even if you do not need to take insulin.  Eat a healthy diet. Ask for help if you need it.   Exercise regularly. Ask for help if you need it.     Diet and exercise can help:   Reduce the amount of insulin you need.   Your body to use your insulin more effectively.   Reduce your risk of high blood pressure (or reduce your blood pressure, if it is high).   Reduce your cholesterol level.   Reduce your weight.  Healthy lifestyle choices play an important role in controlling cardiovascular disease (heart attack, stroke, vascular disease, and others), which is a primary complication of diabetes. For optimal control of diabetes, you must reduce your cardiovascular risks and manage your blood sugar. MEDICATIONS BESIDES INSULIN   Your caregiver may recommend medicines for you in addition to insulin. This will depend on 3 things:    Your diabetes type.   How well insulin alone meets your treatment goals.   Other health factors.  There are different types of medicines that help treat diabetes. The goal is to control your blood sugar the best way possible, which will reduce your  risk of complications. Adding other medicines may also reduce the amount of insulin you need.   INSULIN   People with type 1 diabetes must take insulin to stay alive. Their body does not produce it.  People with type 2 diabetes might require insulin in addition to, or instead of, other medicines. In either case, proper use of insulin is critical to control your diabetes.   There are a number of different types of insulin. Usually, you will give yourself injections, though others can be trained to give them to you. Some people have an insulin pump that delivers insulin continuously through a soft, flexible tube (canula) that is placed under the skin of the abdomen. Other sites including the hips, thighs, or upper arms may also be used.   Using insulin requires that you check your blood sugar several times a day. The exact number of times and time of day to check your blood sugar will vary depending on your type of diabetes, your type of insulin, and treatment goals.  Your caregiver will direct you.   Generally, different insulins have different properties. The following is a general guide. Specifics will vary by product, and new products are introduced periodically.    Short-acting insulin starts working quickly (in as little as 5 minutes) and wears off in 3 to 6 hours (sometimes longer). This type of insulin works well when taken before a meal to bring your blood sugar quickly back to normal. There are several different types of short-acting insulin. Some work quickly and others last longer in your system.   Intermediate-acting insulin starts working in 2 hours and wears off after about 10 to 18 hours. This insulin will lower your blood sugar for a longer period of time, but will not be as effective in lowering your blood sugar right after a meal.   Long-acting insulin mimics the small amount of insulin that your pancreas usually produces throughout the day. You need to have some insulin present at all times, as it is crucial to the metabolism of brain cells and other cells. Long-acting insulin is meant to be used either once or twice a day. It is usually used in combination with other types of insulin, or in combination with other diabetes medicines.  Discuss the type of insulin you are taking with your caregiver or pharmacist. You will then be aware of when the insulin can be expected to peak and when it will wear off.   Your caregiver will usually have a strategy in mind when treating you with insulin. This will vary with your type of diabetes, your diabetes treatment goals, and your health history. It is important that you understand something about this strategy so you may be a partner in treating your diabetes. Here are some terms you might hear:  Basal insulin. You need to have a small amount of insulin present in your blood at all times. Sometimes oral medicines will be enough. For other people, and especially for people with type 1 diabetes, insulin is  needed. Usually, intermediate-acting or long-acting insulin is used once or twice a day to accomplish this.   Prandial (meal-related) insulin. Your blood sugar will rise rapidly after a meal. Short-acting insulin can be used right before the meal to bring your blood sugar back to normal quickly. You might be instructed to adjust the amount of insulin depending on how much carbohydrate (starch) is in your meal.   Corrective insulin. You might be instructed to check your blood sugar  at certain times of the day. You then might use a small amount of short-acting insulin to bring the blood sugar down to normal if it is elevated.   Tight control (also called intensive therapy). Tight control is keeping your blood sugar as close to your target as possible and keeping it from going too high after meals. People with tight control of their diabetes may have fewer long-term complications from their diabetes.   Glycohemoglobin (also called glyco, glycosylated hemoglobin, Hemoglobin A1c, or A1c) level. This measures how well your blood sugar has been controlled during the past 1 to 3 months. It helps your caregiver see how effective your treatment is and decide if any changes are needed. Your caregiver will discuss your target glycohemoglobin with you.  Insulin treatment requires your careful attention. Treatment plans will be different for different people. Some people do well with a simple program. Others require more complicated programs with multiple insulin injections daily. You will work with your caregiver to develop the best program for you. Regardless of your insulin treatment plan, you must also do your best on weight control, diet and food choices, exercise, blood pressure control, and cholesterol control.   BENEFITS OF DIABETES TREATMENT   Research shows that optimal treatment of your diabetes will reduce the risk of kidney, eye, and nerve complications. If you have type 1 diabetes, your risk of heart and  vascular disease also decreases with good diabetes control. The better you control your blood sugars (and the lower your glycohemoglobin), the lower your risk of complications. RISKS OF INSULIN Although insulin treatment is important, it does have some risks. Insulin treatment may be complex, but it is critical for maintaining your good health. Frequent follow-up visits with your caregiver, at least early on, are usually needed.  Insulin can cause your blood sugar to go too low (hypoglycemia). This can be a dangerous complication that must be quickly recognized and treated.   Weight gain can occur.   Improper injection technique can cause hypoglycemia, skin injury or irritation, or other problems. You must learn to inject insulin properly.   Other medicines used for diabetes can have other complications. Discuss your medicines and their complications with your caregiver.  GOALS OF DIABETES CARE   The goal of treating your diabetes is to allow you to live as long as you can with as few complications as possible. Several factors help you work towards this goal:  You should achieve a blood sugar as close to normal as possible without causing hypoglycemia. Generally, this means a glycohemoglobin of between 6% and 7%.   You should achieve and maintain an ideal body weight.   You should exercise regularly.   Your blood pressure should be under 130/80.   Your cholesterol should be controlled, with your LDL under 100. Your target may be different depending on your health factors.   You should not smoke.   You should be up to date on immunizations, including influenza and pneumonia.   You should be monitored for eye, kidney, heart, and vascular health regularly. Preventative medicines are sometimes used for these conditions.  Your specific goals may vary, depending on your health factors. You should discuss issues with your caregiver.   HOME CARE INSTRUCTIONS    Do not smoke.   Eat a healthy  diet as instructed. Ask for help if you need it.   Exercise regularly. Ask for help if you need it.   Stay up to date on your immunizations.  See your caregiver on a regular basis.   Follow your diabetes care plan carefully. Take medicines and insulin as directed. Check your blood sugar as directed, and keep track of it in a log. Understand your glycohemoglobin and other diabetes goals. Understand how to detect and treat hypoglycemia.   Follow your care plan for blood pressure and elevated cholesterol if you have these problems.   Do your blood tests as directed. This is important and helps monitor your diabetes.   Check your feet every night for sores or wounds.   See your eye doctor once a year.   If you have an illness that causes loss of appetite, vomiting, or diarrhea, you should speak with your caregiver about temporary changes in your insulin doses and other medicines.  SEEK MEDICAL CARE IF:  You are having problems keeping your blood sugar at target range.   You are having episodes of hypoglycemia.   You are having side effects from your medicines.   You have symptoms of an illness that are not improving after 3 to 4 days.   You have a sore or wound that is not healing.   You notice a change in vision or a new problem with your vision.   You develop a fever of more than 100.5 F (38.1 C).  SEEK IMMEDIATE MEDICAL CARE IF:    Your blood sugar goes below 70, especially if you have confusion, lightheadedness, or other symptoms.   Your blood sugar is very high (as advised by your caregiver) twice in a row.   An unexplained oral temperature above 102 F (38.9 C) develops.   You pass out.   You have chest pain or trouble breathing.   You have a sudden, severe headache.   You have sudden weakness in one arm or leg.   You have sudden difficulty speaking or swallowing.   You develop vomiting or diarrhea that is getting worse or not improving after 1 day.   Document Released: 08/21/2008 Document Revised: 08/17/2011 Document Reviewed: 09/18/2010 [facility] Patient Information 2013 [place], [place].

## 2012-04-22 NOTE — Progress Notes (Signed)
Subjective:    Patient ID: Christy Rogers, female    DOB: December 16, 1985, 26 y.o.   MRN: [id number]  HPI  Pt presents to clinic today for f/u of newly diagnosed DM Type 1. She has no complaints since her last visit with me.  Review of Systems     History reviewed. No pertinent past medical history.  Current Outpatient Prescriptions  Medication Sig Dispense Refill  . insulin glargine (LANTUS SOLOSTAR) 100 UNIT/ML injection Inject 14 Units into the skin at bedtime.  5 pen  PRN  . insulin regular (HUMULIN R) 100 units/mL injection Check blood sugar before each meal If BS < 150: 0 Units If BS 151-200: 2 units If BS 201-250: 4 units If BS 251-300: 6 units If BS 301-350: 8 units If BS 351-400: 10 units If BS > 400: 12 units and call your doctor  10 mL  12    No Known Allergies  Family History  Problem Relation Age of Onset  . Hypertension Mother   . Diabetes Mother   . Hypertension Father   . Hypertension Maternal Grandmother   . Birth defects Maternal Grandfather   . Hypertension Maternal Grandfather   . Heart disease Paternal Grandmother   . Hypertension Paternal Grandmother   . Hypertension Paternal Grandfather     History   Social History  . Marital Status: Single    Spouse Name: N/A    Number of Children: N/A  . Years of Education: N/A   Occupational History  . Not on file.   Social History Main Topics  . Smoking status: Never Smoker   . Smokeless tobacco: Not on file  . Alcohol Use: 0.6 oz/week    1 Cans of beer per week  . Drug Use: No  . Sexually Active: No   Other Topics Concern  . Not on file   Social History Narrative  . No narrative on file     Constitutional: Denies fever, malaise, fatigue, headache or abrupt weight changes.  HEENT: Denies eye pain, eye redness, ear pain, ringing in the ears, wax buildup, runny nose, nasal congestion, bloody nose, or sore throat. Respiratory: Denies difficulty breathing, shortness of breath, cough or sputum  production.   Cardiovascular: Denies chest pain, chest tightness, palpitations or swelling in the hands or feet.  Gastrointestinal: Denies abdominal pain, bloating, constipation, diarrhea or blood in the stool.  GU: Denies urgency, frequency, pain with urination, burning sensation, blood in urine, odor or discharge. Musculoskeletal: Denies decrease in range of motion, difficulty with gait, muscle pain or joint pain and swelling.  Skin: Denies redness, rashes, lesions or ulcercations.  Neurological: Denies dizziness, difficulty with memory, difficulty with speech or problems with balance and coordination.   No other specific complaints in a complete review of systems (except as listed in HPI above).  Objective:   Physical Exam  BP 102/78  Pulse 89  Temp 98.6 F (37 C) (Oral)  Ht 5\' 3"  (1.6 m)  Wt 161 lb (73.029 kg)  BMI 28.52 kg/m2  SpO2 97%  LMP 04/17/2012 Wt Readings from Last 3 Encounters:  04/22/12 161 lb (73.029 kg)  04/18/12 160 lb (72.576 kg)  09/19/11 174 lb (78.926 kg)    General: Appears their stated age, well developed, well nourished in NAD. Skin: Warm, dry and intact. No rashes, lesions or ulcerations noted. HEENT: Head: normal shape and size; Eyes: sclera white, no icterus, conjunctiva pink, PERRLA and EOMs intact; Ears: Tm's gray and intact, normal light reflex; Nose: mucosa  pink and moist, septum midline; Throat/Mouth: Teeth present, mucosa pink and moist, no exudate, lesions or ulcerations noted.  Neck: Normal range of motion. Neck supple, trachea midline. No massses, lumps or thyromegaly present.  Cardiovascular: Normal rate and rhythm. S1,S2 noted.  No murmur, rubs or gallops noted. No JVD or BLE edema. No carotid bruits noted. Pulmonary/Chest: Normal effort and positive vesicular breath sounds. No respiratory distress. No wheezes, rales or ronchi noted.  Abdomen: Soft and nontender. Normal bowel sounds, no bruits noted. No distention or masses noted. Liver, spleen  and kidneys non palpable. Musculoskeletal: Normal range of motion. No signs of joint swelling. No difficulty with gait.  Neurological: Alert and oriented. Cranial nerves II-XII intact. Coordination normal. +DTRs bilaterally. Psychiatric: Mood and affect normal. Behavior is normal. Judgment and thought content normal.   EKG:  BMET    Component Value Date/Time   NA 139 04/18/2012 1000   K 4.1 04/18/2012 1000   CL 108 04/18/2012 1000   CO2 24 04/18/2012 1000   GLUCOSE 236* 04/18/2012 1000   BUN 11 04/18/2012 1000   CREATININE 0.7 04/18/2012 1000   CALCIUM 9.1 04/18/2012 1000    Lipid Panel     Component Value Date/Time   CHOL 155 04/18/2012 1000   TRIG 60.0 04/18/2012 1000   HDL 39.50 04/18/2012 1000   CHOLHDL 4 04/18/2012 1000   VLDL 12.0 04/18/2012 1000   LDLCALC 104* 04/18/2012 1000    CBC    Component Value Date/Time   WBC 6.2 04/18/2012 1000   RBC 5.53* 04/18/2012 1000   HGB 12.4 04/18/2012 1000   HCT 39.1 04/18/2012 1000   PLT 324.0 04/18/2012 1000   MCV 70.7* 04/18/2012 1000   MCHC 31.7 04/18/2012 1000   RDW 14.9* 04/18/2012 1000    Hgb A1C Lab Results  Component Value Date   HGBA1C 12.3* 04/18/2012      Assessment & Plan:   Diabetes Mellitus Type 1, new onset with additional workup required:  Will obtain u/a to look for ketones Will obtain urine microalbumin.  Diabetic foot exam performed today Pneumovax vaccine given today eRx given for Lantus 14 units QHS eRx given for Regular Insulin sliding scale Pt provided with One Touch ultra meter Diabetes education provided, time spent with patient 45 minute face to face Referral to endocrinology for further diabetes management Referral to opthomology for diabetic eye exam Referral to diabetes education and nutrition  RTC in two weeks for follow up DM 1, call if you have any questions.

## 2012-04-22 NOTE — Addendum Note (Signed)
Addended by: Brenton Grills C on: 04/22/2012 10:49 AM   Modules accepted: Orders

## 2012-04-25 ENCOUNTER — Encounter: Payer: Self-pay | Admitting: Internal Medicine

## 2012-04-25 ENCOUNTER — Other Ambulatory Visit: Payer: Self-pay | Admitting: Internal Medicine

## 2012-04-25 ENCOUNTER — Telehealth: Payer: Self-pay | Admitting: *Deleted

## 2012-04-25 DIAGNOSIS — E109 Type 1 diabetes mellitus without complications: Secondary | ICD-10-CM

## 2012-04-25 MED ORDER — INSULIN GLARGINE 100 UNIT/ML ~~LOC~~ SOLN: 16.0000 [IU] | Freq: Every day | SUBCUTANEOUS | Status: DC

## 2012-04-25 NOTE — Telephone Encounter (Signed)
Ash, Do you mind calling Ms. Plata back. I want her to increase her Lantus insulin (the one she takes before bed) to 16 units QHS. Thx! Rene Kocher

## 2012-04-25 NOTE — Telephone Encounter (Signed)
Pt informed of RB's advisement regarding insulin adjustment.

## 2012-04-25 NOTE — Progress Notes (Signed)
See telephone note. Fasting sugars 170-190. Preprandial around 200. Will increase Lantus to 16 units QHS.

## 2012-04-25 NOTE — Telephone Encounter (Signed)
Pt informed of lab results. Pt states that her blood sugars have been running around 170-190 in the morning and in the 200's around lunch/dinner. She states that the insulin is working and that she doing okay with the insulin.

## 2012-04-25 NOTE — Telephone Encounter (Signed)
Left message for pt to callback office.   Ash,  Can you please send this to Ms. Raul Del. Also call her please and let her know that her urine microalbumin level is high. This indicates that your kidneys are being affected by the diabetes. I would not worry at this time, hopefully as we get you blood sugar level under control, this level will become normal again. We need to recheck this in 6 months.  Can you also she how Her blood sugars are running and how she is doin g with the insulin? Thx!  Rene Kocher

## 2012-05-09 ENCOUNTER — Ambulatory Visit: Payer: Federal, State, Local not specified - PPO | Admitting: Internal Medicine

## 2012-05-09 DIAGNOSIS — E119 Type 2 diabetes mellitus without complications: Secondary | ICD-10-CM | POA: Insufficient documentation

## 2012-05-18 ENCOUNTER — Ambulatory Visit: Payer: Federal, State, Local not specified - PPO | Admitting: *Deleted

## 2013-04-14 ENCOUNTER — Emergency Department (INDEPENDENT_AMBULATORY_CARE_PROVIDER_SITE_OTHER)
Admission: EM | Admit: 2013-04-14 | Discharge: 2013-04-14 | Disposition: A | Discharge status: Home / Self Care | Payer: Self-pay | Source: Home / Self Care | Attending: Family Medicine | Admitting: Family Medicine

## 2013-04-14 ENCOUNTER — Encounter (HOSPITAL_COMMUNITY): Payer: Self-pay | Admitting: Emergency Medicine

## 2013-04-14 DIAGNOSIS — A088 Other specified intestinal infections: Secondary | ICD-10-CM

## 2013-04-14 DIAGNOSIS — A084 Viral intestinal infection, unspecified: Secondary | ICD-10-CM

## 2013-04-14 HISTORY — DX: Type 2 diabetes mellitus without complications: E11.9

## 2013-04-14 HISTORY — DX: Essential (primary) hypertension: I10

## 2013-04-14 LAB — POCT I-STAT, CHEM 8
BUN: 14 mg/dL (ref 6–23)
Chloride: 105 mEq/L (ref 96–112)
Glucose, Bld: 159 mg/dL — ABNORMAL HIGH (ref 70–99)
HCT: 41 % (ref 36.0–46.0)
Hemoglobin: 13.9 g/dL (ref 12.0–15.0)
Potassium: 4.2 mEq/L (ref 3.5–5.1)
Sodium: 139 mEq/L (ref 135–145)

## 2013-04-14 LAB — LIPASE, BLOOD: Lipase: 37 U/L (ref 11–59)

## 2013-04-14 LAB — CBC
MCH: 22.9 pg — ABNORMAL LOW (ref 26.0–34.0)
MCHC: 33.2 g/dL (ref 30.0–36.0)
MCV: 68.8 fL — ABNORMAL LOW (ref 78.0–100.0)
RBC: 5.2 MIL/uL — ABNORMAL HIGH (ref 3.87–5.11)
RDW: 14 % (ref 11.5–15.5)
WBC: 9.1 10*3/uL (ref 4.0–10.5)

## 2013-04-14 LAB — COMPREHENSIVE METABOLIC PANEL
AST: 19 U/L (ref 0–37)
Alkaline Phosphatase: 70 U/L (ref 39–117)
CO2: 23 mEq/L (ref 19–32)
Chloride: 103 mEq/L (ref 96–112)
Creatinine, Ser: 0.67 mg/dL (ref 0.50–1.10)
GFR calc Af Amer: >90 mL/min (ref >90)

## 2013-04-14 MED ORDER — PROMETHAZINE HCL 25 MG PO TABS: 25.0000 mg | ORAL_TABLET | Freq: Four times a day (QID) | ORAL | Status: DC | PRN

## 2013-04-14 MED ORDER — ONDANSETRON 4 MG PO TBDP
8.0000 mg | ORAL_TABLET | Freq: Once | ORAL | Status: AC
  Administered 2013-04-14: 8 mg via ORAL

## 2013-04-14 MED ORDER — ONDANSETRON 4 MG PO TBDP
ORAL_TABLET | ORAL | Status: AC
  Filled 2013-04-14: qty 2

## 2013-04-14 NOTE — ED Provider Notes (Signed)
Christy Rogers is a 27 y.o. female who presents to Urgent Care today for nausea vomiting diarrhea and dizziness. This is been present for the last 45 minutes or so. Patient was at work today when she felt mild abdominal pain. She went to the bathroom and had diarrhea and vomiting. She thinks she may have had a syncopal event but she is not sure of the bathroom. Her blood sugar was checked and found to be 166. She was advised to present here for further evaluation and management. She feels fatigued and somewhat dizzy. She denies any vertigo she describes a lightheadedness feeling. She denies any chest pains palpitations or trouble breathing.   Past Medical History  Diagnosis Date  . Hypertension   . Diabetes mellitus without complication    History  Substance Use Topics  . Smoking status: Never Smoker   . Smokeless tobacco: Not on file  . Alcohol Use: 0.6 oz/week    1 Cans of beer per week   ROS as above Medications reviewed. No current facility-administered medications for this encounter.   Current Outpatient Prescriptions  Medication Sig Dispense Refill  . glucose blood (ONE TOUCH ULTRA TEST) test strip Use as instructed to check blood sugar four times daily dx 250.01  400 each  3  . LISINOPRIL PO Take by mouth.      . metFORMIN (GLUCOPHAGE) 500 MG tablet Take 500 mg by mouth 2 (two) times daily with a meal.      . ONETOUCH DELICA LANCETS MISC Use as directed to check blood sugar four times daily dx 250.01  400 each  3  . promethazine (PHENERGAN) 25 MG tablet Take 1 tablet (25 mg total) by mouth every 6 (six) hours as needed for nausea or vomiting.  30 tablet  0    Exam:  BP 114/77  Pulse 100  Temp(Src) 97.8 F (36.6 C) (Oral)  Resp 18  SpO2 98% Filed Vitals:   04/14/13 1228 04/14/13 1319 04/14/13 1320 04/14/13 1322  BP: 109/75 108/72 109/76 114/77  Pulse:  80 98 100  Temp:      TempSrc:      Resp:      SpO2: 98%      Gen: Well NAD HEENT: EOMI,  MMM Lungs: CTABL Nl  WOB Heart: RRR no MRG Abd: NABS, NT, ND no rebound or guarding Exts: Non edematous BL  LE, warm and well perfused.   Results for orders placed during the hospital encounter of 04/14/13 (from the past 24 hour(s))  CBC     Status: Abnormal   Collection Time    04/14/13 12:36 PM      Result Value Range   WBC 9.1  4.0 - 10.5 K/uL   RBC 5.20 (*) 3.87 - 5.11 MIL/uL   Hemoglobin 11.9 (*) 12.0 - 15.0 g/dL   HCT 66.4 (*) 40.3 - 47.4 %   MCV 68.8 (*) 78.0 - 100.0 fL   MCH 22.9 (*) 26.0 - 34.0 pg   MCHC 33.2  30.0 - 36.0 g/dL   RDW 25.9  56.3 - 87.5 %   Platelets 319  150 - 400 K/uL  POCT I-STAT, CHEM 8     Status: Abnormal   Collection Time    04/14/13  1:12 PM      Result Value Range   Sodium 139  135 - 145 mEq/L   Potassium 4.2  3.5 - 5.1 mEq/L   Chloride 105  96 - 112 mEq/L   BUN 14  6 - 23 mg/dL   Creatinine, Ser 1.61  0.50 - 1.10 mg/dL   Glucose, Bld 096 (*) 70 - 99 mg/dL   Calcium, Ion 0.45  4.09 - 1.23 mmol/L   TCO2 23  0 - 100 mmol/L   Hemoglobin 13.9  12.0 - 15.0 g/dL   HCT 81.1  91.4 - 78.2 %   No results found.  Assessment and Plan: 27 y.o. female with viral gastroenteritis. Plan to treat with Phenergan. Additionally I recommend lots of fluids. Followup with primary care provider. Discussed warning signs or symptoms. Please see discharge instructions. Patient expresses understanding.      Christy Bong, MD 04/14/13 1415

## 2013-04-14 NOTE — ED Notes (Signed)
Pt c/o feeling lightheaded today at work around [employer] states she went to the bathroom b/c she was feeling dizzy and started to have abd pain accompanied by diarrhea and nausea Reports LOC; regained conscious and immediately was given chocolate by staff members They checked her sugars and it was 166... Hx of DM type 1 and HTN She is alert w/no signs of acute distress.

## 2013-05-15 ENCOUNTER — Encounter (HOSPITAL_COMMUNITY): Payer: Self-pay | Admitting: Emergency Medicine

## 2013-05-15 ENCOUNTER — Emergency Department (HOSPITAL_COMMUNITY)
Admission: EM | Admit: 2013-05-15 | Discharge: 2013-05-15 | Disposition: A | Discharge status: Home / Self Care | Payer: Commercial Managed Care - PPO | Attending: Emergency Medicine | Admitting: Emergency Medicine

## 2013-05-15 DIAGNOSIS — E119 Type 2 diabetes mellitus without complications: Secondary | ICD-10-CM | POA: Insufficient documentation

## 2013-05-15 DIAGNOSIS — R110 Nausea: Secondary | ICD-10-CM | POA: Insufficient documentation

## 2013-05-15 DIAGNOSIS — I10 Essential (primary) hypertension: Secondary | ICD-10-CM | POA: Insufficient documentation

## 2013-05-15 DIAGNOSIS — R1012 Left upper quadrant pain: Secondary | ICD-10-CM | POA: Insufficient documentation

## 2013-05-15 DIAGNOSIS — R197 Diarrhea, unspecified: Secondary | ICD-10-CM | POA: Insufficient documentation

## 2013-05-15 DIAGNOSIS — R109 Unspecified abdominal pain: Secondary | ICD-10-CM

## 2013-05-15 DIAGNOSIS — Z79899 Other long term (current) drug therapy: Secondary | ICD-10-CM | POA: Insufficient documentation

## 2013-05-15 DIAGNOSIS — Z9089 Acquired absence of other organs: Secondary | ICD-10-CM | POA: Insufficient documentation

## 2013-05-15 LAB — URINALYSIS, ROUTINE W REFLEX MICROSCOPIC
Bilirubin Urine: NEGATIVE
Glucose, UA: NEGATIVE mg/dL
Hgb urine dipstick: NEGATIVE
Ketones, ur: NEGATIVE mg/dL
Protein, ur: NEGATIVE mg/dL
Urobilinogen, UA: 0.2 mg/dL (ref 0.0–1.0)

## 2013-05-15 LAB — CBC WITH DIFFERENTIAL/PLATELET
Basophils Absolute: 0 10*3/uL (ref 0.0–0.1)
Eosinophils Relative: 0 % (ref 0–5)
HCT: 37 % (ref 36.0–46.0)
Hemoglobin: 12.1 g/dL (ref 12.0–15.0)
Lymphocytes Relative: 30 % (ref 12–46)
Lymphs Abs: 2.8 10*3/uL (ref 0.7–4.0)
MCV: 68.1 fL — ABNORMAL LOW (ref 78.0–100.0)
Monocytes Absolute: 0.8 10*3/uL (ref 0.1–1.0)
Neutro Abs: 5.9 10*3/uL (ref 1.7–7.7)
Neutrophils Relative %: 62 % (ref 43–77)
Platelets: 287 10*3/uL (ref 150–400)
RBC: 5.43 MIL/uL — ABNORMAL HIGH (ref 3.87–5.11)
WBC: 9.6 10*3/uL (ref 4.0–10.5)

## 2013-05-15 LAB — BLOOD GAS, VENOUS
Acid-base deficit: 2.9 mmol/L — ABNORMAL HIGH (ref 0.0–2.0)
Bicarbonate: 22.4 mEq/L (ref 20.0–24.0)
O2 Saturation: 47.1 %
Patient temperature: 98.6
TCO2: 20.7 mmol/L (ref 0–100)
pO2, Ven: 30.2 mmHg (ref 30.0–45.0)

## 2013-05-15 LAB — COMPREHENSIVE METABOLIC PANEL
ALT: 10 U/L (ref 0–35)
AST: 13 U/L (ref 0–37)
CO2: 22 mEq/L (ref 19–32)
Calcium: 8.9 mg/dL (ref 8.4–10.5)
Chloride: 103 mEq/L (ref 96–112)
Creatinine, Ser: 0.72 mg/dL (ref 0.50–1.10)
GFR calc Af Amer: >90 mL/min (ref >90)
GFR calc non Af Amer: >90 mL/min (ref >90)
Glucose, Bld: 100 mg/dL — ABNORMAL HIGH (ref 70–99)
Potassium: 4.1 mEq/L (ref 3.5–5.1)
Total Bilirubin: 0.3 mg/dL (ref 0.3–1.2)

## 2013-05-15 MED ORDER — ONDANSETRON HCL 4 MG/2ML IJ SOLN
4.0000 mg | Freq: Once | INTRAMUSCULAR | Status: AC
  Administered 2013-05-15: 4 mg via INTRAVENOUS
  Filled 2013-05-15: qty 2

## 2013-05-15 MED ORDER — MORPHINE SULFATE 4 MG/ML IJ SOLN
6.0000 mg | Freq: Once | INTRAMUSCULAR | Status: AC
  Administered 2013-05-15: 6 mg via INTRAVENOUS
  Filled 2013-05-15: qty 2

## 2013-05-15 MED ORDER — SODIUM CHLORIDE 0.9 % IV SOLN
Freq: Once | INTRAVENOUS | Status: AC
  Administered 2013-05-15: 06:00:00 via INTRAVENOUS

## 2013-05-15 MED ORDER — SODIUM CHLORIDE 0.9 % IV BOLUS (SEPSIS)
1000.0000 mL | Freq: Once | INTRAVENOUS | Status: AC
  Administered 2013-05-15: 1000 mL via INTRAVENOUS

## 2013-05-15 NOTE — ED Provider Notes (Signed)
Medical screening examination/treatment/procedure(s) were performed by non-physician practitioner and as supervising physician I was immediately available for consultation/collaboration.    Sunnie Nielsen, MD 05/15/13 [phone]

## 2013-05-15 NOTE — ED Notes (Signed)
6 mg morphine ordered for pt however pt achieved adequate pain relief after 2 mg. Dondra Spry, NP notified.

## 2013-05-15 NOTE — ED Provider Notes (Signed)
CSN: [id number]     Arrival date & time 05/15/13  7829 History   First MD Initiated Contact with Patient 05/15/13 [phone]     Chief Complaint  Patient presents with  . Abdominal Pain   (Consider location/radiation/quality/duration/timing/severity/associated sxs/prior Treatment) HPI Comments: NIDDM now with recurrent LUQ pain , nausea and diarrhea.  Has not checked her blood sugar in 24 hours  Her PCP is in [facility]   Patient is a 27 y.o. female presenting with abdominal pain.  Abdominal Pain Pain location:  LUQ Pain quality: cramping   Pain radiates to:  Does not radiate Pain severity:  Moderate Onset quality:  Gradual Duration:  1 day Timing:  Constant Chronicity:  Recurrent Context: retching   Context: not diet changes   Relieved by:  None tried Worsened by:  Eating Ineffective treatments:  None tried Associated symptoms: diarrhea and nausea   Associated symptoms: no chest pain, no constipation, no dysuria, no fever, no flatus, no hematemesis, no hematochezia, no shortness of breath, no sore throat and no vomiting     Past Medical History  Diagnosis Date  . Hypertension   . Diabetes mellitus without complication    Past Surgical History  Procedure Laterality Date  . Appendectomy     Family History  Problem Relation Age of Onset  . Hypertension Mother   . Diabetes Mother   . Hypertension Father   . Hypertension Maternal Grandmother   . Birth defects Maternal Grandfather   . Hypertension Maternal Grandfather   . Heart disease Paternal Grandmother   . Hypertension Paternal Grandmother   . Hypertension Paternal Grandfather    History  Substance Use Topics  . Smoking status: Never Smoker   . Smokeless tobacco: Not on file  . Alcohol Use: 0.6 oz/week    1 Cans of beer per week   OB History   Grav Para Term Preterm Abortions TAB SAB Ect Mult Living                 Review of Systems  Constitutional: Negative for fever.  HENT: Negative for sore throat.    Respiratory: Negative for shortness of breath.   Cardiovascular: Negative for chest pain.  Gastrointestinal: Positive for nausea, abdominal pain and diarrhea. Negative for vomiting, constipation, hematochezia, flatus and hematemesis.  Genitourinary: Negative for dysuria.  Musculoskeletal: Negative for myalgias.  Skin: Negative for rash.  Neurological: Negative for dizziness.  All other systems reviewed and are negative.    Allergies  Review of patient's allergies indicates no known allergies.  Home Medications   Current Outpatient Rx  Name  Route  Sig  Dispense  Refill  . glucose blood (ONE TOUCH ULTRA TEST) test strip      Use as instructed to check blood sugar four times daily dx 250.01   400 each   3   . LISINOPRIL PO   Oral   Take by mouth.         . metFORMIN (GLUCOPHAGE) 500 MG tablet   Oral   Take 500 mg by mouth 2 (two) times daily with a meal.         . ONETOUCH DELICA LANCETS MISC      Use as directed to check blood sugar four times daily dx 250.01   400 each   3   . promethazine (PHENERGAN) 25 MG tablet   Oral   Take 1 tablet (25 mg total) by mouth every 6 (six) hours as needed for nausea or vomiting.  30 tablet   0    LMP 03/15/2013 Physical Exam  Nursing note and vitals reviewed. Constitutional: She is oriented to person, place, and time. She appears well-developed and well-nourished.  HENT:  Head: Normocephalic.  Eyes: Pupils are equal, round, and reactive to light.  Neck: Normal range of motion.  Cardiovascular: Normal rate and regular rhythm.   Pulmonary/Chest: Effort normal and breath sounds normal.  Abdominal: Soft. Bowel sounds are normal. She exhibits no distension. There is tenderness. There is no rebound and no guarding.  Musculoskeletal: Normal range of motion.  Neurological: She is alert and oriented to person, place, and time.  Skin: No rash noted.    ED Course  Procedures (including critical care time) Labs Review Labs  Reviewed  CBC WITH DIFFERENTIAL  COMPREHENSIVE METABOLIC PANEL  LIPASE, BLOOD  URINALYSIS, ROUTINE W REFLEX MICROSCOPIC  BLOOD GAS, VENOUS   Imaging Review No results found.  EKG Interpretation   None       MDM  No diagnosis found.  Will hydrate obtain labs    Arman Filter, NP 05/15/13 [phone]

## 2013-05-15 NOTE — ED Provider Notes (Signed)
Pt received in sign out from NP Manus Rudd at shift change.  This is a 27 year old female past medical history significant for diabetes, presenting to the ED for cramping, left upper quadrant abdominal pain, nausea, and diarrhea. States she's had these sx intermittently for the past year.  Has not checked her blood sugar in 24 hours.  Plan:  Labs in process, monitor sx.  Likely discharge when resulted.   Results for orders placed during the hospital encounter of 05/15/13  CBC WITH DIFFERENTIAL      Result Value Range   WBC 9.6  4.0 - 10.5 K/uL   RBC 5.43 (*) 3.87 - 5.11 MIL/uL   Hemoglobin 12.1  12.0 - 15.0 g/dL   HCT 45.4  09.8 - 11.9 %   MCV 68.1 (*) 78.0 - 100.0 fL   MCH 22.3 (*) 26.0 - 34.0 pg   MCHC 32.7  30.0 - 36.0 g/dL   RDW 14.7  82.9 - 56.2 %   Platelets 287  150 - 400 K/uL   Neutrophils Relative % 62  43 - 77 %   Neutro Abs 5.9  1.7 - 7.7 K/uL   Lymphocytes Relative 30  12 - 46 %   Lymphs Abs 2.8  0.7 - 4.0 K/uL   Monocytes Relative 8  3 - 12 %   Monocytes Absolute 0.8  0.1 - 1.0 K/uL   Eosinophils Relative 0  0 - 5 %   Eosinophils Absolute 0.0  0.0 - 0.7 K/uL   Basophils Relative 0  0 - 1 %   Basophils Absolute 0.0  0.0 - 0.1 K/uL  COMPREHENSIVE METABOLIC PANEL      Result Value Range   Sodium 137  135 - 145 mEq/L   Potassium 4.1  3.5 - 5.1 mEq/L   Chloride 103  96 - 112 mEq/L   CO2 22  19 - 32 mEq/L   Glucose, Bld 100 (*) 70 - 99 mg/dL   BUN 14  6 - 23 mg/dL   Creatinine, Ser 1.30  0.50 - 1.10 mg/dL   Calcium 8.9  8.4 - 86.5 mg/dL   Total Protein 7.8  6.0 - 8.3 g/dL   Albumin 4.0  3.5 - 5.2 g/dL   AST 13  0 - 37 U/L   ALT 10  0 - 35 U/L   Alkaline Phosphatase 64  39 - 117 U/L   Total Bilirubin 0.3  0.3 - 1.2 mg/dL   GFR calc non Af Amer >90  >90 mL/min   GFR calc Af Amer >90  >90 mL/min  LIPASE, BLOOD      Result Value Range   Lipase 45  11 - 59 U/L  URINALYSIS, ROUTINE W REFLEX MICROSCOPIC      Result Value Range   Color, Urine YELLOW  YELLOW   APPearance  CLEAR  CLEAR   Specific Gravity, Urine 1.021  1.005 - 1.030   pH 5.5  5.0 - 8.0   Glucose, UA NEGATIVE  NEGATIVE mg/dL   Hgb urine dipstick NEGATIVE  NEGATIVE   Bilirubin Urine NEGATIVE  NEGATIVE   Ketones, ur NEGATIVE  NEGATIVE mg/dL   Protein, ur NEGATIVE  NEGATIVE mg/dL   Urobilinogen, UA 0.2  0.0 - 1.0 mg/dL   Nitrite NEGATIVE  NEGATIVE   Leukocytes, UA NEGATIVE  NEGATIVE  BLOOD GAS, VENOUS      Result Value Range   FIO2 0.21     pH, Ven 7.336 (*) 7.250 - 7.300   pCO2, Ven 43.1 (*)  45.0 - 50.0 mmHg   pO2, Ven 30.2  30.0 - 45.0 mmHg   Bicarbonate 22.4  20.0 - 24.0 mEq/L   TCO2 20.7  0 - 100 mmol/L   Acid-base deficit 2.9 (*) 0.0 - 2.0 mmol/L   O2 Saturation 47.1     Patient temperature 98.6     Collection site VEIN     Drawn by COLLECTED BY LABORATORY     Sample type VEIN     No results found.  6:38 AM Labs as above, no significant findings.  Pt reassessed.  Sleeping in room with partner in bed.  States she feels better.  Has tolerated PO without recurrent nausea or vomiting.  Pt afebrile, non-toxic appearing, NAD, VS stable- ok for discharge.  Offered nausea meds for home, pt declined.  FU with PCP.  Discussed plan with pt, she agreed.  Return precautions advised.  Garlon Hatchet, PA-C 05/15/13 [phone]

## 2013-05-15 NOTE — ED Provider Notes (Signed)
Medical screening examination/treatment/procedure(s) were performed by non-physician practitioner and as supervising physician I was immediately available for consultation/collaboration.    Sunnie Nielsen, MD 05/15/13 2255

## 2013-05-15 NOTE — ED Notes (Signed)
Venos blood gas drawn and analyzed in mini lab

## 2013-05-15 NOTE — ED Notes (Signed)
abd pain x 6 hours; nausea

## 2013-05-16 ENCOUNTER — Ambulatory Visit: Payer: Commercial Managed Care - PPO | Attending: Internal Medicine | Admitting: Internal Medicine

## 2013-05-16 ENCOUNTER — Encounter: Payer: Self-pay | Admitting: Internal Medicine

## 2013-05-16 DIAGNOSIS — E119 Type 2 diabetes mellitus without complications: Secondary | ICD-10-CM

## 2013-05-16 NOTE — Patient Instructions (Signed)
Diabetes and Exercise Exercising regularly is important. It is not just about losing weight. It has many health benefits, such as:  Improving your overall fitness, flexibility, and endurance.  Increasing your bone density.  Helping with weight control.  Decreasing your body fat.  Increasing your muscle strength.  Reducing stress and tension.  Improving your overall health. People with diabetes who exercise gain additional benefits because exercise:  Reduces appetite.  Improves the body's use of blood sugar (glucose).  Helps lower or control blood glucose.  Decreases blood pressure.  Helps control blood lipids (such as cholesterol and triglycerides).  Improves the body's use of the hormone insulin by:  Increasing the body's insulin sensitivity.  Reducing the body's insulin needs.  Decreases the risk for heart disease because exercising:  Lowers cholesterol and triglycerides levels.  Increases the levels of good cholesterol (such as high-density lipoproteins [HDL]) in the body.  Lowers blood glucose levels. YOUR ACTIVITY PLAN  Choose an activity that you enjoy and set realistic goals. Your health care provider or diabetes educator can help you make an activity plan that works for you. You can break activities into 2 or 3 sessions throughout the day. Doing so is as good as one long session. Exercise ideas include:  Taking the dog for a walk.  Taking the stairs instead of the elevator.  Dancing to your favorite song.  Doing your favorite exercise with a friend. RECOMMENDATIONS FOR EXERCISING WITH TYPE 1 OR TYPE 2 DIABETES   Check your blood glucose before exercising. If blood glucose levels are greater than 240 mg/dL, check for urine ketones. Do not exercise if ketones are present.  Avoid injecting insulin into areas of the body that are going to be exercised. For example, avoid injecting insulin into:  The arms when playing tennis.  The legs when  jogging.  Keep a record of:  Food intake before and after you exercise.  Expected peak times of insulin action.  Blood glucose levels before and after you exercise.  The type and amount of exercise you have done.  Review your records with your health care provider. Your health care provider will help you to develop guidelines for adjusting food intake and insulin amounts before and after exercising.  If you take insulin or oral hypoglycemic agents, watch for signs and symptoms of hypoglycemia. They include:  Dizziness.  Shaking.  Sweating.  Chills.  Confusion.  Drink plenty of water while you exercise to prevent dehydration or heat stroke. Body water is lost during exercise and must be replaced.  Talk to your health care provider before starting an exercise program to make sure it is safe for you. Remember, almost any type of activity is better than none. Document Released: 08/15/2003 Document Revised: 01/25/2013 Document Reviewed: 11/01/2012 Exit[employer] Patient Information 2014 [employer]. Diabetes Meal Planning Guide The diabetes meal planning guide is a tool to help you plan your meals and snacks. It is important for people with diabetes to manage their blood glucose (sugar) levels. Choosing the right foods and the right amounts throughout your day will help control your blood glucose. Eating right can even help you improve your blood pressure and reach or maintain a healthy weight. CARBOHYDRATE COUNTING MADE EASY When you eat carbohydrates, they turn to sugar. This raises your blood glucose level. Counting carbohydrates can help you control this level so you feel better. When you plan your meals by counting carbohydrates, you can have more flexibility in what you eat and balance your medicine   with your food intake. Carbohydrate counting simply means adding up the total amount of carbohydrate grams in your meals and snacks. Try to eat about the same amount at each meal. Foods  with carbohydrates are listed below. Each portion below is 1 carbohydrate serving or 15 grams of carbohydrates. Ask your dietician how many grams of carbohydrates you should eat at each meal or snack. Grains and Starches  1 slice bread.   English muffin or hotdog/hamburger bun.   cup cold cereal (unsweetened).   cup cooked pasta or rice.   cup starchy vegetables (corn, potatoes, peas, beans, winter squash).  1 tortilla (6 inches).   bagel.  1 waffle or pancake (size of a CD).   cup cooked cereal.  4 to 6 small crackers. *Whole grain is recommended. Fruit  1 cup fresh unsweetened berries, melon, papaya, pineapple.  1 small fresh fruit.   banana or mango.   cup fruit juice (4 oz unsweetened).   cup canned fruit in natural juice or water.  2 tbs dried fruit.  12 to 15 grapes or cherries. Milk and Yogurt  1 cup fat-free or 1% milk.  1 cup soy milk.  6 oz light yogurt with sugar-free sweetener.  6 oz low-fat soy yogurt.  6 oz plain yogurt. Vegetables  1 cup raw or  cup cooked is counted as 0 carbohydrates or a "free" food.  If you eat 3 or more servings at 1 meal, count them as 1 carbohydrate serving. Other Carbohydrates   oz chips or pretzels.   cup ice cream or frozen yogurt.   cup sherbet or sorbet.  2 inch square cake, no frosting.  1 tbs honey, sugar, jam, jelly, or syrup.  2 small cookies.  3 squares of graham crackers.  3 cups popcorn.  6 crackers.  1 cup broth-based soup.  Count 1 cup casserole or other mixed foods as 2 carbohydrate servings.  Foods with less than 20 calories in a serving may be counted as 0 carbohydrates or a "free" food. You may want to purchase a book or computer software that lists the carbohydrate gram counts of different foods. In addition, the nutrition facts panel on the labels of the foods you eat are a good source of this information. The label will tell you how big the serving size is and the  total number of carbohydrate grams you will be eating per serving. Divide this number by 15 to obtain the number of carbohydrate servings in a portion. Remember, 1 carbohydrate serving equals 15 grams of carbohydrate. SERVING SIZES Measuring foods and serving sizes helps you make sure you are getting the right amount of food. The list below tells how big or small some common serving sizes are.  1 oz.........4 stacked dice.  3 oz.........Deck of cards.  1 tsp........Tip of little finger.  1 tbs........Thumb.  2 tbs........Golf ball.   cup.......Half of a fist.  1 cup........A fist. SAMPLE DIABETES MEAL PLAN Below is a sample meal plan that includes foods from the grain and starches, dairy, vegetable, fruit, and meat groups. A dietician can individualize a meal plan to fit your calorie needs and tell you the number of servings needed from each food group. However, controlling the total amount of carbohydrates in your meal or snack is more important than making sure you include all of the food groups at every meal. You may interchange carbohydrate containing foods (dairy, starches, and fruits). The meal plan below is an example of a 2000 calorie diet   using carbohydrate counting. This meal plan has 17 carbohydrate servings. Breakfast  1 cup oatmeal (2 carb servings).   cup light yogurt (1 carb serving).  1 cup blueberries (1 carb serving).   cup almonds. Snack  1 large apple (2 carb servings).  1 low-fat string cheese stick. Lunch  Chicken breast salad.  1 cup spinach.   cup chopped tomatoes.  2 oz chicken breast, sliced.  2 tbs low-fat Italian dressing.  12 whole-wheat crackers (2 carb servings).  12 to 15 grapes (1 carb serving).  1 cup low-fat milk (1 carb serving). Snack  1 cup carrots.   cup hummus (1 carb serving). Dinner  3 oz broiled salmon.  1 cup brown rice (3 carb servings). Snack  1  cups steamed broccoli (1 carb serving) drizzled with 1  tsp olive oil and lemon juice.  1 cup light pudding (2 carb servings). DIABETES MEAL PLANNING WORKSHEET Your dietician can use this worksheet to help you decide how many servings of foods and what types of foods are right for you.  BREAKFAST Food Group and Servings / Carb Servings Grain/Starches __________________________________ Dairy __________________________________________ Vegetable ______________________________________ Fruit ___________________________________________ Meat __________________________________________ Fat ____________________________________________ LUNCH Food Group and Servings / Carb Servings Grain/Starches ___________________________________ Dairy ___________________________________________ Fruit ____________________________________________ Meat ___________________________________________ Fat _____________________________________________ DINNER Food Group and Servings / Carb Servings Grain/Starches ___________________________________ Dairy ___________________________________________ Fruit ____________________________________________ Meat ___________________________________________ Fat _____________________________________________ SNACKS Food Group and Servings / Carb Servings Grain/Starches ___________________________________ Dairy ___________________________________________ Vegetable _______________________________________ Fruit ____________________________________________ Meat ___________________________________________ Fat _____________________________________________ DAILY TOTALS Starches _________________________ Vegetable ________________________ Fruit ____________________________ Dairy ____________________________ Meat ____________________________ Fat ______________________________ Document Released: 02/19/2005 Document Revised: 08/17/2011 Document Reviewed: 12/31/2008 ExitCare Patient Information 2014 [employer].  

## 2013-05-16 NOTE — Progress Notes (Signed)
Patient ID: Christy Rogers, female   DOB: 1986/01/14, 27 y.o.   MRN: [id number] Patient Demographics  Christy Rogers, is a 27 y.o. female  ZHY:[id number]  [id number]  DOB - [birth year]  CC: No chief complaint on file.      HPI: Christy Rogers is a 27 y.o. female here today to establish medical care. Patient is known to have type 1 diabetes diagnosed in 2013, currently on metformin 1000 mg tablet by mouth twice a day. Major complaint is abdominal pain as a result of medication, associated with intense nausea and occasional vomiting. Abdominal pain is mostly left upper quadrant, but sometimes crampy in the lower quadrant. No change in bowel habit, no urinary symptoms. She smoke cigarette occasionally, she drinks alcohol occasionally.her mother is also diabetic. She has questionable MI a year ago, since then has been on lisinopril 2.5 mg tablet by mouth daily. Patient has No headache, No chest pain, No new weakness tingling or numbness, No Cough - SOB.  No Known Allergies Past Medical History  Diagnosis Date  . Hypertension   . Diabetes mellitus without complication    Current Outpatient Prescriptions on File Prior to Visit  Medication Sig Dispense Refill  . glucose blood (ONE TOUCH ULTRA TEST) test strip Use as instructed to check blood sugar four times daily dx 250.01  400 each  3  . ibuprofen (ADVIL,MOTRIN) 200 MG tablet Take 200 mg by mouth every 6 (six) hours as needed (pain).      Marland Kitchen LISINOPRIL PO Take 2.5 mg by mouth daily.       . metFORMIN (GLUCOPHAGE) 500 MG tablet Take 1,000 mg by mouth 2 (two) times daily with a meal.       . ONETOUCH DELICA LANCETS MISC Use as directed to check blood sugar four times daily dx 250.01  400 each  3   No current facility-administered medications on file prior to visit.   Family History  Problem Relation Age of Onset  . Diabetes Mother   . Hypertension Father   . Hypertension Maternal Grandmother   . Birth defects Maternal Grandfather   .  Hypertension Maternal Grandfather   . Heart disease Paternal Grandmother   . Hypertension Paternal Grandmother   . Hypertension Paternal Grandfather    History   Social History  . Marital Status: Single    Spouse Name: N/A    Number of Children: N/A  . Years of Education: N/A   Occupational History  . Not on file.   Social History Main Topics  . Smoking status: Never Smoker   . Smokeless tobacco: Not on file  . Alcohol Use: 0.6 oz/week    1 Cans of beer per week  . Drug Use: No  . Sexual Activity: No   Other Topics Concern  . Not on file   Social History Narrative  . No narrative on file    Review of Systems: Constitutional: Negative for fever, chills, diaphoresis, activity change, appetite change and fatigue. HENT: Negative for ear pain, nosebleeds, congestion, facial swelling, rhinorrhea, neck pain, neck stiffness and ear discharge.  Eyes: Negative for pain, discharge, redness, itching and visual disturbance. Respiratory: Negative for cough, choking, chest tightness, shortness of breath, wheezing and stridor.  Cardiovascular: Negative for chest pain, palpitations and leg swelling. Gastrointestinal: Negative for abdominal distention. Genitourinary: Negative for dysuria, urgency, frequency, hematuria, flank pain, decreased urine volume, difficulty urinating and dyspareunia.  Musculoskeletal: Negative for back pain, joint swelling, arthralgia and gait problem. Neurological: Negative for dizziness, tremors,  seizures, syncope, facial asymmetry, speech difficulty, weakness, light-headedness, numbness and headaches.  Hematological: Negative for adenopathy. Does not bruise/bleed easily. Psychiatric/Behavioral: Negative for hallucinations, behavioral problems, confusion, dysphoric mood, decreased concentration and agitation.    Objective:  There were no vitals filed for this visit.  Physical Exam: Constitutional: Patient appears well-developed and well-nourished. No  distress. HENT: Normocephalic, atraumatic, External right and left ear normal. Oropharynx is clear and moist.  Eyes: Conjunctivae and EOM are normal. PERRLA, no scleral icterus. Neck: Normal ROM. Neck supple. No JVD. No tracheal deviation. No thyromegaly. CVS: RRR, S1/S2 +, no murmurs, no gallops, no carotid bruit.  Pulmonary: Effort and breath sounds normal, no stridor, rhonchi, wheezes, rales.  Abdominal: Soft. BS +, no distension, tenderness, rebound or guarding.  Musculoskeletal: Normal range of motion. No edema and no tenderness.  Lymphadenopathy: No lymphadenopathy noted, cervical, inguinal or axillary Neuro: Alert. Normal reflexes, muscle tone coordination. No cranial nerve deficit. Skin: Skin is warm and dry. No rash noted. Not diaphoretic. No erythema. No pallor. Psychiatric: Normal mood and affect. Behavior, judgment, thought content normal.  Lab Results  Component Value Date   WBC 9.6 05/15/2013   HGB 12.1 05/15/2013   HCT 37.0 05/15/2013   MCV 68.1* 05/15/2013   PLT 287 05/15/2013   Lab Results  Component Value Date   CREATININE 0.72 05/15/2013   BUN 14 05/15/2013   NA 137 05/15/2013   K 4.1 05/15/2013   CL 103 05/15/2013   CO2 22 05/15/2013    Lab Results  Component Value Date   HGBA1C 12.3* 04/18/2012   Lipid Panel     Component Value Date/Time   CHOL 155 04/18/2012 1000   TRIG 60.0 04/18/2012 1000   HDL 39.50 04/18/2012 1000   CHOLHDL 4 04/18/2012 1000   VLDL 12.0 04/18/2012 1000   LDLCALC 104* 04/18/2012 1000       Assessment and plan:  Hemoglobin A1c test today is 6.0 1. Diabetes Reduce metformin to 500 mg tablet by mouth twice a day Continue low-dose lisinopril 2.5 mg tablet by mouth daily  Patient counseled about nutrition and exercise - HgB A1c - Ambulatory referral to diabetic education   Follow up in 3 months or when necessary   The patient was given clear instructions to go to ER or return to medical center if symptoms don't improve, worsen or  new problems develop. The patient verbalized understanding. The patient was told to call to get lab results if they haven't heard anything in the next week.     Jeanann Lewandowsky, MD, MHA, FACP, FAAP [facility] [place], [place] [phone]   05/16/2013, 5:40 PM

## 2013-05-16 NOTE — Progress Notes (Signed)
Pt is here to establish care. Recently visited the ED for severe abdominal pain from taking Metformin. HBA1c today is 6.0.

## 2013-05-29 ENCOUNTER — Other Ambulatory Visit: Payer: Self-pay | Admitting: Internal Medicine

## 2013-05-29 MED ORDER — FREESTYLE SYSTEM KIT: 1.0000 | PACK | Status: DC | PRN

## 2013-07-11 MED ORDER — LISINOPRIL 2.5 MG PO TABS: 2.5000 mg | ORAL_TABLET | Freq: Every day | ORAL | Status: DC

## 2013-07-11 MED ORDER — METFORMIN HCL 500 MG PO TABS: 1000.0000 mg | ORAL_TABLET | Freq: Two times a day (BID) | ORAL | Status: DC

## 2013-07-11 NOTE — Addendum Note (Signed)
Addended by: Susie CassetteABROL MD, Germain OsgoodNAYANA on: 07/11/2013 09:45 AM   Modules accepted: Orders

## 2013-07-12 ENCOUNTER — Encounter: Payer: Self-pay | Admitting: *Deleted

## 2013-08-01 ENCOUNTER — Ambulatory Visit: Payer: Self-pay | Admitting: *Deleted

## 2013-08-09 ENCOUNTER — Other Ambulatory Visit: Payer: Self-pay | Admitting: Internal Medicine

## 2013-08-09 DIAGNOSIS — R55 Syncope and collapse: Secondary | ICD-10-CM | POA: Insufficient documentation

## 2013-08-21 ENCOUNTER — Ambulatory Visit: Payer: Self-pay | Admitting: Internal Medicine

## 2013-08-24 ENCOUNTER — Ambulatory Visit: Payer: Self-pay | Admitting: Neurology

## 2013-09-29 ENCOUNTER — Encounter (HOSPITAL_COMMUNITY): Payer: Self-pay | Admitting: Emergency Medicine

## 2013-09-29 ENCOUNTER — Emergency Department (HOSPITAL_COMMUNITY)
Admission: EM | Admit: 2013-09-29 | Discharge: 2013-09-29 | Disposition: A | Discharge status: Home / Self Care | Payer: Commercial Managed Care - PPO | Attending: Emergency Medicine | Admitting: Emergency Medicine

## 2013-09-29 DIAGNOSIS — J302 Other seasonal allergic rhinitis: Secondary | ICD-10-CM

## 2013-09-29 DIAGNOSIS — J309 Allergic rhinitis, unspecified: Secondary | ICD-10-CM | POA: Insufficient documentation

## 2013-09-29 DIAGNOSIS — I10 Essential (primary) hypertension: Secondary | ICD-10-CM | POA: Insufficient documentation

## 2013-09-29 DIAGNOSIS — E119 Type 2 diabetes mellitus without complications: Secondary | ICD-10-CM

## 2013-09-29 DIAGNOSIS — J329 Chronic sinusitis, unspecified: Secondary | ICD-10-CM

## 2013-09-29 DIAGNOSIS — Z79899 Other long term (current) drug therapy: Secondary | ICD-10-CM | POA: Insufficient documentation

## 2013-09-29 HISTORY — DX: Acute myocardial infarction, unspecified: I21.9

## 2013-09-29 LAB — CBG MONITORING, ED: GLUCOSE-CAPILLARY: 146 mg/dL — AB (ref 70–99)

## 2013-09-29 MED ORDER — OXYCODONE-ACETAMINOPHEN 5-325 MG PO TABS
1.0000 | ORAL_TABLET | Freq: Once | ORAL | Status: AC
  Administered 2013-09-29: 1 via ORAL
  Filled 2013-09-29: qty 1

## 2013-09-29 MED ORDER — LISINOPRIL 2.5 MG PO TABS: 2.5000 mg | ORAL_TABLET | Freq: Every day | ORAL | Status: DC

## 2013-09-29 MED ORDER — METFORMIN HCL 500 MG PO TABS: 1000.0000 mg | ORAL_TABLET | Freq: Two times a day (BID) | ORAL | Status: DC

## 2013-09-29 MED ORDER — AZITHROMYCIN 250 MG PO TABS
500.0000 mg | ORAL_TABLET | Freq: Once | ORAL | Status: AC
  Administered 2013-09-29: 500 mg via ORAL
  Filled 2013-09-29: qty 2

## 2013-09-29 MED ORDER — AZITHROMYCIN 250 MG PO TABS: 250.0000 mg | ORAL_TABLET | Freq: Every day | ORAL | Status: DC

## 2013-09-29 MED ORDER — LORATADINE 10 MG PO TABS: 10.0000 mg | ORAL_TABLET | Freq: Every day | ORAL | Status: DC

## 2013-09-29 NOTE — ED Provider Notes (Signed)
Medical screening examination/treatment/procedure(s) were performed by non-physician practitioner and as supervising physician I was immediately available for consultation/collaboration.   EKG Interpretation None        William Mishael Haran, MD 09/29/13 2334 

## 2013-09-29 NOTE — ED Provider Notes (Signed)
CSN: [id number]     Arrival date & time 09/29/13  2225 History   First MD Initiated Contact with Patient 09/29/13 2251     No chief complaint on file.    (Consider location/radiation/quality/duration/timing/severity/associated sxs/prior Treatment) HPI  Patient to the ER with  Complaints of sinus pressure, sore throat, cough, and nasal congestion for the past 3 days. She has a PMH hypertension and diabetes. She has not been on her medications for the past two weeks because she ran out. She takes Metformin and Lisinopril 2.4m. She denies having nausea, vomiting, diarrhea, weakness, abdominal pains. Patient has a low grade temperature of 100 and is mildly tachycardic at 101. She has not tried any medications at home.  Past Medical History  Diagnosis Date  . Hypertension   . Diabetes mellitus without complication    Past Surgical History  Procedure Laterality Date  . Appendectomy      1989   Family History  Problem Relation Age of Onset  . Diabetes Mother   . Hypertension Father   . Hypertension Maternal Grandmother   . Birth defects Maternal Grandfather   . Hypertension Maternal Grandfather   . Heart disease Paternal Grandmother   . Hypertension Paternal Grandmother   . Hypertension Paternal Grandfather    History  Substance Use Topics  . Smoking status: Never Smoker   . Smokeless tobacco: Not on file  . Alcohol Use: No   OB History   Grav Para Term Preterm Abortions TAB SAB Ect Mult Living                 Review of Systems  HENT: Positive for sinus pressure and sore throat.   All other systems reviewed and are negative.     Allergies  Review of patient's allergies indicates no known allergies.  Home Medications   Prior to Admission medications   Medication Sig Start Date End Date Taking? Authorizing Provider  glucose blood (ONE TOUCH ULTRA TEST) test strip Use as instructed to check blood sugar four times daily dx 250.01 04/22/12   RWebb Silversmith NP  glucose  monitoring kit (FREESTYLE) monitoring kit 1 each by Does not apply route as needed for other. 05/29/13   OAngelica Chessman MD  ibuprofen (ADVIL,MOTRIN) 200 MG tablet Take 200 mg by mouth every 6 (six) hours as needed (pain).    Historical Provider, MD  lisinopril (PRINIVIL,ZESTRIL) 2.5 MG tablet Take 1 tablet (2.5 mg total) by mouth daily. 07/11/13   NReyne Dumas MD  metFORMIN (GLUCOPHAGE) 500 MG tablet Take 2 tablets (1,000 mg total) by mouth 2 (two) times daily with a meal. 07/11/13   NReyne Dumas MD  [facility] Use as directed to check blood sugar four times daily dx 250.01 04/22/12   RWebb Silversmith NP   BP 141/92  Pulse 101  Temp(Src) 100 F (37.8 C) (Oral)  Resp 20  SpO2 99% Physical Exam  Constitutional: She is oriented to person, place, and time. She appears well-developed and well-nourished.  HENT:  Head: Normocephalic and atraumatic.  Nose: Rhinorrhea and sinus tenderness present.  Mouth/Throat: Posterior oropharyngeal erythema present. No oropharyngeal exudate.  Eyes: Conjunctivae are normal. Pupils are equal, round, and reactive to light.  Neck: Trachea normal, normal range of motion and full passive range of motion without pain. Neck supple.  Cardiovascular: Normal rate, regular rhythm, normal heart sounds and normal pulses.   Pulmonary/Chest: Effort normal and breath sounds normal. Chest wall is not dull to percussion. She exhibits no tenderness,  no crepitus, no edema, no deformity and no retraction.  Abdominal: Soft. Normal appearance and bowel sounds are normal.  Musculoskeletal: Normal range of motion.  Neurological: She is alert and oriented to person, place, and time. She has normal strength. No cranial nerve deficit or sensory deficit.  Skin: Skin is warm, dry and intact.  Psychiatric: She has a normal mood and affect. Her speech is normal and behavior is normal. Judgment and thought content normal. Cognition and memory are normal.    ED Course   Procedures (including critical care time) Labs Review Labs Reviewed  CBG MONITORING, ED - Abnormal; Notable for the following:    Glucose-Capillary 146 (*)    All other components within normal limits    Imaging Review No results found.   EKG Interpretation None      MDM   Final diagnoses:  Sinus infection  Seasonal allergies  Diabetes  Hypertension    CBG is 146.  The patient does not appear sick or toxic. She has not had her Metformin or Lisinopril for the past 2 weeks because she has run out. Will write refills.  Will start on Claritin and give Azithromycin. Patient needs to follow-up with the PCP.  28 y.o.[place] R Melamed's evaluation in the Emergency Department is complete. It has been determined that no acute conditions requiring further emergency intervention are present at this time. The patient/guardian have been advised of the diagnosis and plan. We have discussed signs and symptoms that warrant return to the ED, such as changes or worsening in symptoms.  Vital signs are stable at discharge. Filed Vitals:   09/29/13 2253  BP: 141/92  Pulse: 101  Temp: 100 F (37.8 C)  Resp: 20    Patient/guardian has voiced understanding and agreed to follow-up with the PCP or specialist.     Linus Mako, PA-C 09/29/13 2307

## 2013-09-29 NOTE — Discharge Instructions (Signed)

## 2013-09-29 NOTE — ED Notes (Signed)
Pt c/o sore throat, facial pain and HA onset yesterday, congestion x 1 month.

## 2013-12-09 ENCOUNTER — Emergency Department (HOSPITAL_BASED_OUTPATIENT_CLINIC_OR_DEPARTMENT_OTHER)
Admission: EM | Admit: 2013-12-09 | Discharge: 2013-12-10 | Disposition: A | Discharge status: Home / Self Care | Payer: BC Managed Care – PPO | Attending: Emergency Medicine | Admitting: Emergency Medicine

## 2013-12-09 ENCOUNTER — Encounter (HOSPITAL_BASED_OUTPATIENT_CLINIC_OR_DEPARTMENT_OTHER): Payer: Self-pay | Admitting: Emergency Medicine

## 2013-12-09 ENCOUNTER — Emergency Department (HOSPITAL_BASED_OUTPATIENT_CLINIC_OR_DEPARTMENT_OTHER): Payer: BC Managed Care – PPO

## 2013-12-09 DIAGNOSIS — I252 Old myocardial infarction: Secondary | ICD-10-CM | POA: Insufficient documentation

## 2013-12-09 DIAGNOSIS — R1032 Left lower quadrant pain: Secondary | ICD-10-CM | POA: Insufficient documentation

## 2013-12-09 DIAGNOSIS — Z79899 Other long term (current) drug therapy: Secondary | ICD-10-CM | POA: Insufficient documentation

## 2013-12-09 DIAGNOSIS — E119 Type 2 diabetes mellitus without complications: Secondary | ICD-10-CM | POA: Insufficient documentation

## 2013-12-09 DIAGNOSIS — R1084 Generalized abdominal pain: Secondary | ICD-10-CM

## 2013-12-09 DIAGNOSIS — R110 Nausea: Secondary | ICD-10-CM | POA: Insufficient documentation

## 2013-12-09 DIAGNOSIS — Z792 Long term (current) use of antibiotics: Secondary | ICD-10-CM | POA: Insufficient documentation

## 2013-12-09 DIAGNOSIS — R197 Diarrhea, unspecified: Secondary | ICD-10-CM | POA: Insufficient documentation

## 2013-12-09 DIAGNOSIS — I10 Essential (primary) hypertension: Secondary | ICD-10-CM | POA: Insufficient documentation

## 2013-12-09 DIAGNOSIS — R6883 Chills (without fever): Secondary | ICD-10-CM | POA: Insufficient documentation

## 2013-12-09 DIAGNOSIS — R1012 Left upper quadrant pain: Secondary | ICD-10-CM | POA: Insufficient documentation

## 2013-12-09 LAB — CBC WITH DIFFERENTIAL/PLATELET
BASOS PCT: 1 % (ref 0–1)
Basophils Absolute: 0.1 10*3/uL (ref 0.0–0.1)
Eosinophils Absolute: 0.1 10*3/uL (ref 0.0–0.7)
Eosinophils Relative: 1 % (ref 0–5)
HCT: 34.4 % — ABNORMAL LOW (ref 36.0–46.0)
Hemoglobin: 11.5 g/dL — ABNORMAL LOW (ref 12.0–15.0)
LYMPHS ABS: 3.6 10*3/uL (ref 0.7–4.0)
Lymphocytes Relative: 33 % (ref 12–46)
MCH: 22.9 pg — AB (ref 26.0–34.0)
MCHC: 33.4 g/dL (ref 30.0–36.0)
MCV: 68.5 fL — ABNORMAL LOW (ref 78.0–100.0)
MONO ABS: 1 10*3/uL (ref 0.1–1.0)
Monocytes Relative: 9 % (ref 3–12)
NEUTROS PCT: 56 % (ref 43–77)
Neutro Abs: 6 10*3/uL (ref 1.7–7.7)
Platelets: 302 10*3/uL (ref 150–400)
RBC: 5.02 MIL/uL (ref 3.87–5.11)
RDW: 14.3 % (ref 11.5–15.5)
WBC: 10.8 10*3/uL — ABNORMAL HIGH (ref 4.0–10.5)

## 2013-12-09 LAB — COMPREHENSIVE METABOLIC PANEL
ALT: 14 U/L (ref 0–35)
ANION GAP: 15 (ref 5–15)
AST: 15 U/L (ref 0–37)
Albumin: 4.3 g/dL (ref 3.5–5.2)
Alkaline Phosphatase: 66 U/L (ref 39–117)
BILIRUBIN TOTAL: 0.2 mg/dL — AB (ref 0.3–1.2)
BUN: 15 mg/dL (ref 6–23)
CALCIUM: 9.5 mg/dL (ref 8.4–10.5)
CO2: 22 meq/L (ref 19–32)
CREATININE: 0.6 mg/dL (ref 0.50–1.10)
Chloride: 104 mEq/L (ref 96–112)
GFR calc Af Amer: >90 mL/min (ref >90)
Glucose, Bld: 131 mg/dL — ABNORMAL HIGH (ref 70–99)
Potassium: 3.9 mEq/L (ref 3.7–5.3)
Sodium: 141 mEq/L (ref 137–147)
Total Protein: 8.1 g/dL (ref 6.0–8.3)

## 2013-12-09 LAB — URINALYSIS, ROUTINE W REFLEX MICROSCOPIC
BILIRUBIN URINE: NEGATIVE
Glucose, UA: NEGATIVE mg/dL
Hgb urine dipstick: NEGATIVE
Ketones, ur: NEGATIVE mg/dL
NITRITE: NEGATIVE
Protein, ur: NEGATIVE mg/dL
Specific Gravity, Urine: 1.033 — ABNORMAL HIGH (ref 1.005–1.030)
UROBILINOGEN UA: 1 mg/dL (ref 0.0–1.0)
pH: 6.5 (ref 5.0–8.0)

## 2013-12-09 LAB — URINE MICROSCOPIC-ADD ON

## 2013-12-09 LAB — CBG MONITORING, ED: GLUCOSE-CAPILLARY: 126 mg/dL — AB (ref 70–99)

## 2013-12-09 LAB — LIPASE, BLOOD: LIPASE: 41 U/L (ref 11–59)

## 2013-12-09 MED ORDER — IOHEXOL 300 MG/ML  SOLN
50.0000 mL | Freq: Once | INTRAMUSCULAR | Status: AC | PRN
  Administered 2013-12-09: 50 mL via ORAL

## 2013-12-09 NOTE — ED Notes (Signed)
Pt reports llq pain and anxious feeling in stomach

## 2013-12-09 NOTE — ED Provider Notes (Signed)
CSN: [id number]     Arrival date & time 12/09/13  2208 History  This chart was scribed for Christy Quarryanielle S Gurfateh Mcclain, MD by Christy Rogers, ED Scribe. This patient was seen in room MH12/MH12 and the patient's care was started at 11:19 PM.    Chief Complaint  Patient presents with  . Abdominal Pain    Patient is a 28 y.o. female presenting with abdominal pain. The history is provided by the patient. No language interpreter was used.  Abdominal Pain Pain location:  LUQ and LLQ Pain quality: sharp   Pain severity:  Moderate Onset quality:  Gradual Duration:  9 hours Timing:  Intermittent Associated symptoms: chills, diarrhea and nausea   Associated symptoms: no dysuria, no hematuria and no vaginal discharge    HPI Comments: Christy Rogers is a 28 y.o. female who presents to the Emergency Department complaining of left sided moderate intermittent abdominal pain that started today. She describes the pain as sharp and rates the pain as a 6/10 currently and a 8/10 at its worst. She states that the pain had a gradual onset. She states that she has also been having headaches. She reports associated chills, nausea, and diarrhea. She reports that she has a hx of an appendectomy. She states that her last normal menstrual period was the last week of June. She reports a hx of an appendectomy. She denies any dysuria, hematuria, or vaginal discharge.   Past Medical History  Diagnosis Date  . Hypertension   . Diabetes mellitus without complication   . Myocardial infarction    Past Surgical History  Procedure Laterality Date  . Appendectomy      1989   Family History  Problem Relation Age of Onset  . Diabetes Mother   . Hypertension Father   . Hypertension Maternal Grandmother   . Birth defects Maternal Grandfather   . Hypertension Maternal Grandfather   . Heart disease Paternal Grandmother   . Hypertension Paternal Grandmother   . Hypertension Paternal Grandfather    History  Substance Use Topics  .  Smoking status: Never Smoker   . Smokeless tobacco: Not on file  . Alcohol Use: No   OB History   Grav Para Term Preterm Abortions TAB SAB Ect Mult Living                 Review of Systems  Constitutional: Positive for chills.  Gastrointestinal: Positive for nausea, abdominal pain and diarrhea.  Genitourinary: Negative for dysuria, hematuria and vaginal discharge.  Neurological: Positive for headaches.  All other systems reviewed and are negative.     Allergies  Review of patient's allergies indicates no known allergies.  Home Medications   Prior to Admission medications   Medication Sig Start Date End Date Taking? Authorizing Provider  lisinopril (PRINIVIL,ZESTRIL) 2.5 MG tablet Take 1 tablet (2.5 mg total) by mouth daily. 09/29/13  Yes Tiffany Irine SealG Greene, PA-C  metFORMIN (GLUCOPHAGE) 500 MG tablet Take 2 tablets (1,000 mg total) by mouth 2 (two) times daily with a meal. 09/29/13  Yes Tiffany Irine SealG Greene, PA-C  azithromycin (ZITHROMAX) 250 MG tablet Take 1 tablet (250 mg total) by mouth daily. Take 1 every day until finished. 09/29/13   Tiffany Irine SealG Greene, PA-C  loratadine (CLARITIN) 10 MG tablet Take 1 tablet (10 mg total) by mouth daily. 09/29/13   Tiffany Irine SealG Greene, PA-C   BP 122/88  Pulse 99  Temp(Src) 98.7 F (37.1 C) (Oral)  Resp 18  SpO2 100%  LMP 11/10/2013 Physical  Exam  Nursing note and vitals reviewed. Constitutional: She is oriented to person, place, and time. She appears well-developed and well-nourished. No distress.  HENT:  Head: Normocephalic and atraumatic.  Eyes: Conjunctivae are normal. Pupils are equal, round, and reactive to light.  Neck: Neck supple. No tracheal deviation present.  Cardiovascular: Normal rate.   Pulmonary/Chest: Effort normal and breath sounds normal. No respiratory distress.  Abdominal: There is tenderness.  Tenderness in the left periumbilical area.  Musculoskeletal: Normal range of motion.  Neurological: She is alert and oriented to  person, place, and time.  Skin: Skin is warm and dry.  Psychiatric: She has a normal mood and affect. Her behavior is normal.    ED Course  Procedures (including critical care time) DIAGNOSTIC STUDIES: Oxygen Saturation is 100% on RA, normal by my interpretation.    COORDINATION OF CARE: 11:23 PM- Pt advised of plan for treatment which includes medication, radiology, labs and pt agrees.  Labs Review Labs Reviewed  URINALYSIS, ROUTINE W REFLEX MICROSCOPIC - Abnormal; Notable for the following:    Specific Gravity, Urine 1.033 (*)    Leukocytes, UA SMALL (*)    All other components within normal limits  CBC WITH DIFFERENTIAL - Abnormal; Notable for the following:    WBC 10.8 (*)    Hemoglobin 11.5 (*)    HCT 34.4 (*)    MCV 68.5 (*)    MCH 22.9 (*)    All other components within normal limits  URINE MICROSCOPIC-ADD ON - Abnormal; Notable for the following:    Squamous Epithelial / LPF FEW (*)    Bacteria, UA MANY (*)    All other components within normal limits  CBG MONITORING, ED - Abnormal; Notable for the following:    Glucose-Capillary 126 (*)    All other components within normal limits  COMPREHENSIVE METABOLIC PANEL  LIPASE, BLOOD    Imaging Review Ct Abdomen Pelvis W Contrast  12/10/2013   CLINICAL DATA:  Left lower quadrant pain.  EXAM: CT ABDOMEN AND PELVIS WITH CONTRAST  TECHNIQUE: Multidetector CT imaging of the abdomen and pelvis was performed using the standard protocol following bolus administration of intravenous contrast.  CONTRAST:  100mL OMNIPAQUE IOHEXOL 300 MG/ML  SOLN  COMPARISON:  None.  FINDINGS: Lung bases are clear.  No effusions.  Heart is normal size.  Liver, spleen, pancreas, adrenals and kidneys are normal. Gallbladder is contracted, grossly unremarkable.  Uterus, adnexae and urinary bladder are unremarkable. Stomach, large and small bowel grossly unremarkable. Trace free fluid in the pelvis, likely physiologic. Aorta is normal caliber.  IMPRESSION: No  acute findings in the abdomen or pelvis.   Electronically Signed   By: Charlett NoseKevin  Dover M.D.   On: 12/10/2013 00:31     EKG Interpretation None      MDM   Final diagnoses:  Generalized abdominal pain  I personally performed the services described in this documentation, which was scribed in my presence. The recorded information has been reviewed and considered.   Patient with left sided abdominal pain of unclear etiology.  Patient improved here after treatment.  Discussed need for pelvic exam but patient refused as she has never had one, denies vaginal complaints, and states not sexually active with men.  Patient advised regarding close follow up and return precautions and voices understanding.    Christy Quarryanielle S Federick Levene, MD 12/10/13 [phone]

## 2013-12-10 MED ORDER — OXYCODONE-ACETAMINOPHEN 5-325 MG PO TABS
1.0000 | ORAL_TABLET | ORAL | Status: DC | PRN
Start: 2013-12-10 — End: 2016-10-06

## 2013-12-10 MED ORDER — HYDROMORPHONE HCL PF 1 MG/ML IJ SOLN
1.0000 mg | Freq: Once | INTRAMUSCULAR | Status: AC
  Administered 2013-12-10: 1 mg via INTRAVENOUS
  Filled 2013-12-10: qty 1

## 2013-12-10 MED ORDER — ONDANSETRON HCL 4 MG/2ML IJ SOLN
4.0000 mg | Freq: Once | INTRAMUSCULAR | Status: AC
  Administered 2013-12-10: 4 mg via INTRAVENOUS
  Filled 2013-12-10: qty 2

## 2013-12-10 MED ORDER — IOHEXOL 300 MG/ML  SOLN
100.0000 mL | Freq: Once | INTRAMUSCULAR | Status: AC | PRN
  Administered 2013-12-10: 100 mL via INTRAVENOUS

## 2013-12-10 MED ORDER — ONDANSETRON 8 MG PO TBDP: 8.0000 mg | ORAL_TABLET | Freq: Three times a day (TID) | ORAL | Status: DC | PRN

## 2013-12-10 NOTE — Discharge Instructions (Signed)

## 2014-01-04 DIAGNOSIS — F321 Major depressive disorder, single episode, moderate: Secondary | ICD-10-CM | POA: Insufficient documentation

## 2015-02-07 ENCOUNTER — Ambulatory Visit: Payer: Self-pay | Admitting: Urgent Care

## 2016-01-20 IMAGING — CT CT ABD-PELV W/ CM
2 of 5 series · 17 of 46 positions shown, 19 images · IV contrast (APPLIED)
Comparison: None.

CLINICAL DATA: Left lower quadrant pain.

EXAM:
CT ABDOMEN AND PELVIS WITH CONTRAST
TECHNIQUE: Multidetector CT imaging of the abdomen and pelvis was performed
using the standard protocol following bolus administration of
intravenous contrast.
CONTRAST:  100mL OMNIPAQUE IOHEXOL 300 MG/ML  SOLN

[Series 2: abd/pelvis 5.0 b31f · axial · 0.71mm/px · z∈[-483,-43]mm · 14 of 100 slices shown, 16 images]
[im 6/100  soft-tissue]
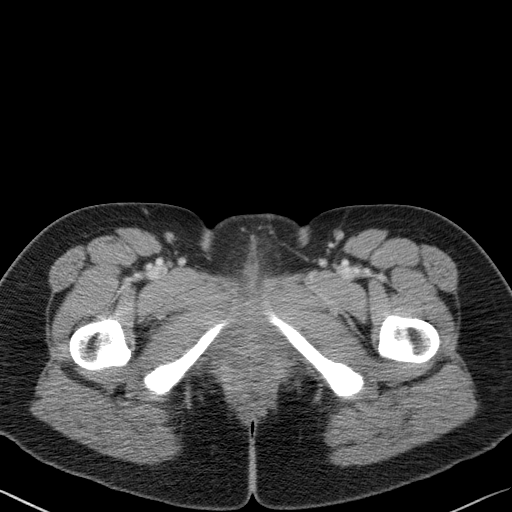
[im 6/100  bone]
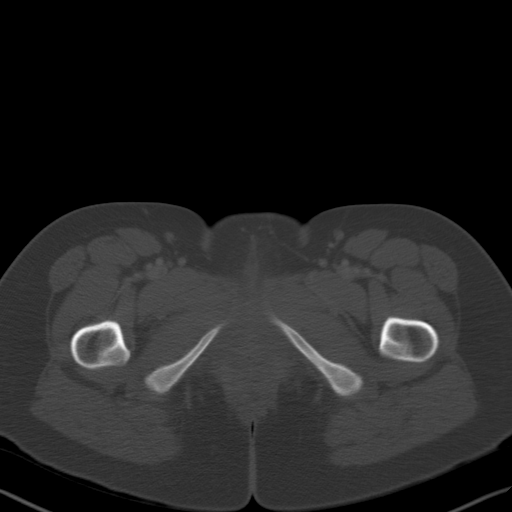
[im 11/100  soft-tissue]
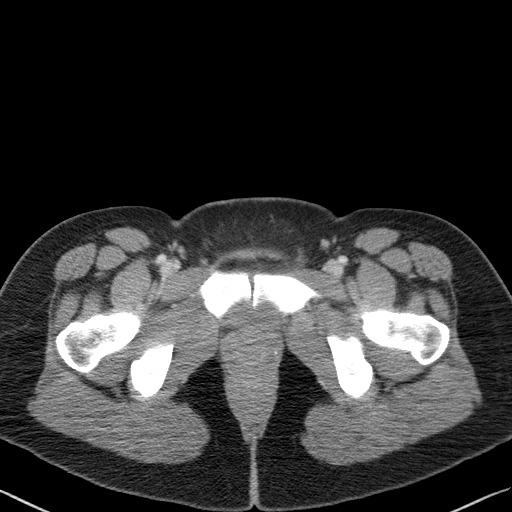
[im 21/100  soft-tissue]
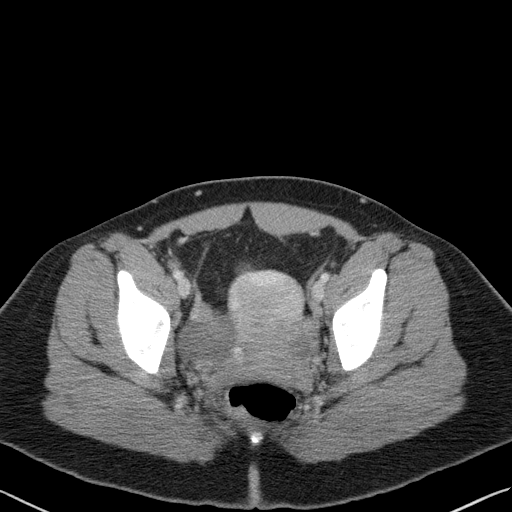
[im 27/100  soft-tissue]
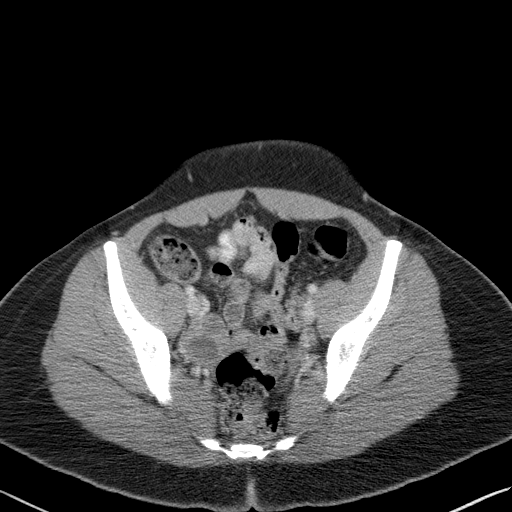
[im 32/100  soft-tissue]
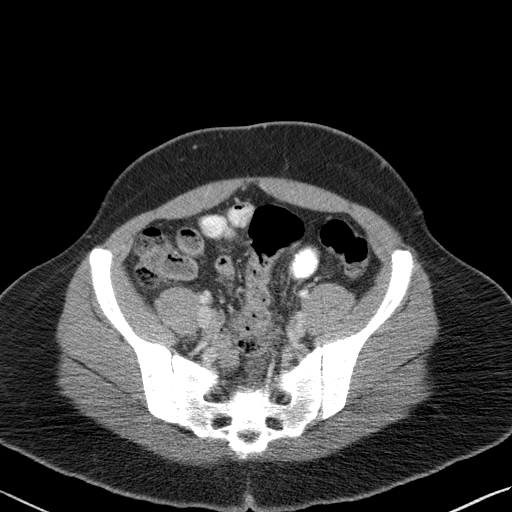
[im 42/100  soft-tissue]
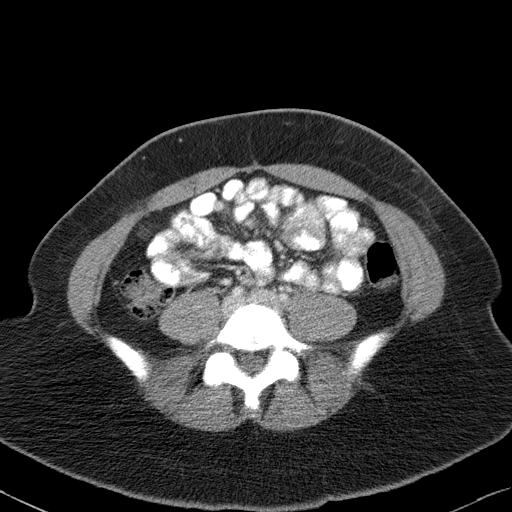
[im 47/100  soft-tissue]
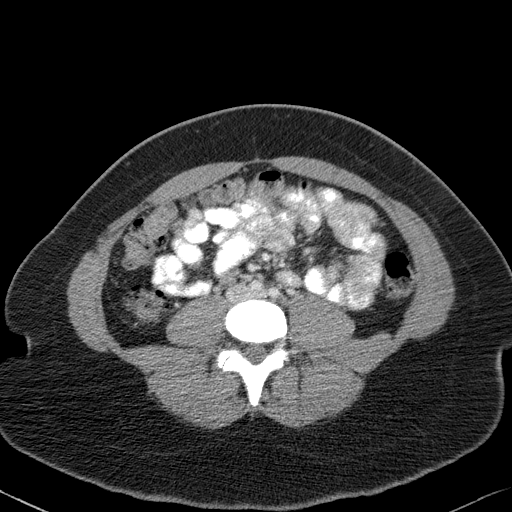
[im 53/100  soft-tissue]
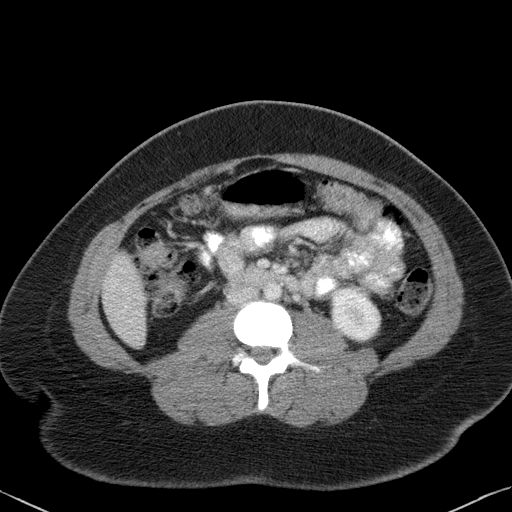
[im 58/100  soft-tissue]
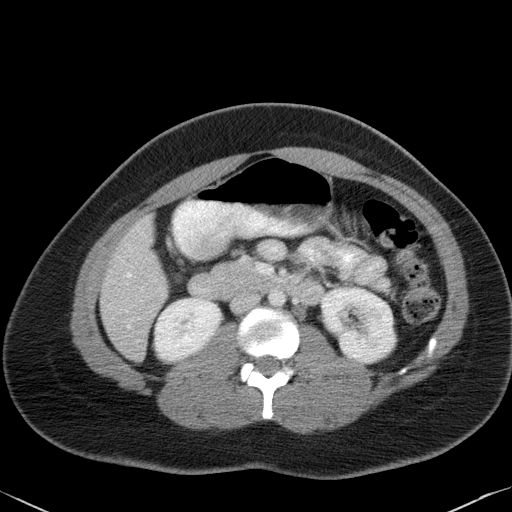
[im 58/100  bone]
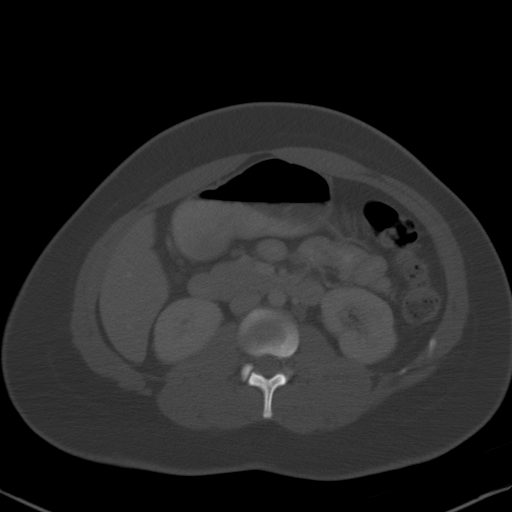
[im 68/100  soft-tissue]
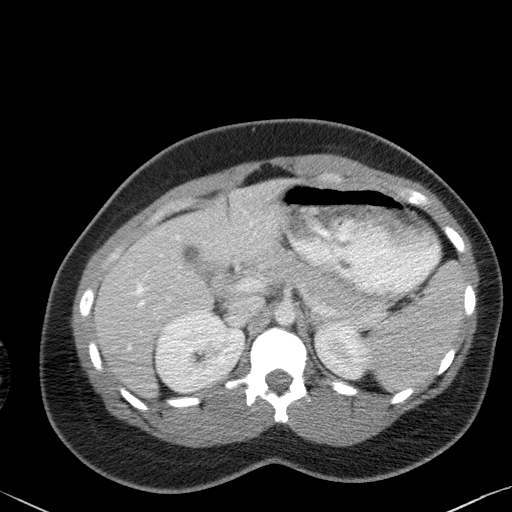
[im 73/100  soft-tissue]
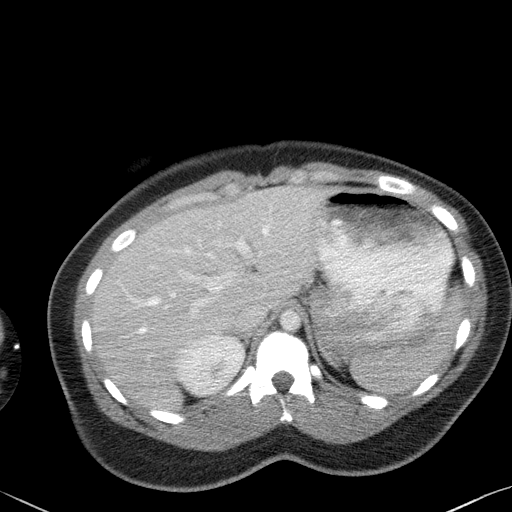
[im 79/100  soft-tissue]
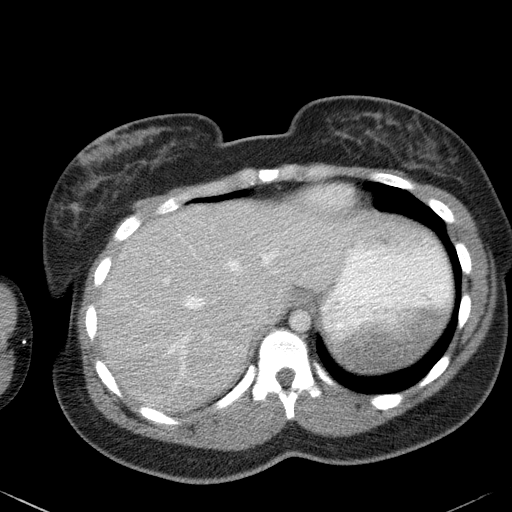
[im 89/100  soft-tissue]
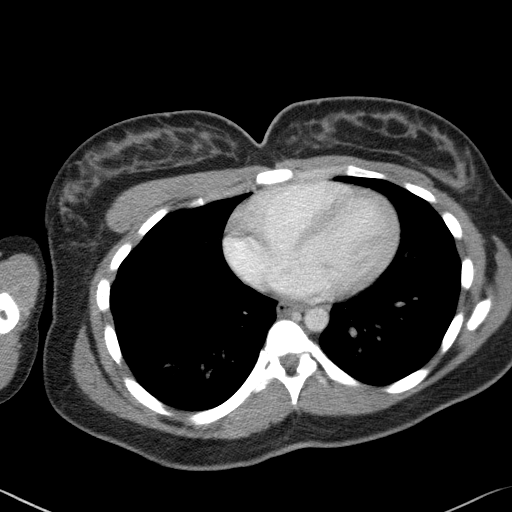
[im 94/100  soft-tissue]
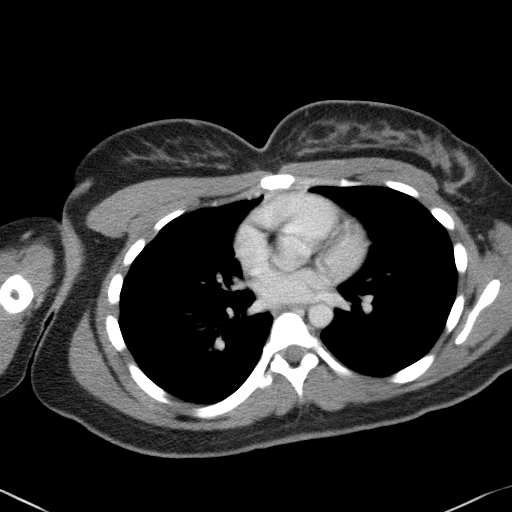

[Series 5: abd/pelvis 3.0 coronal · coronal · 0.93mm/px · 3 of 94 slices shown]
[im 32/94  soft-tissue]
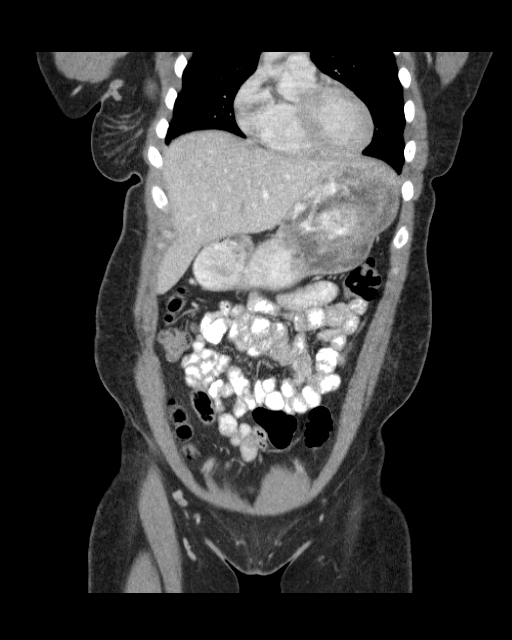
[im 42/94  soft-tissue]
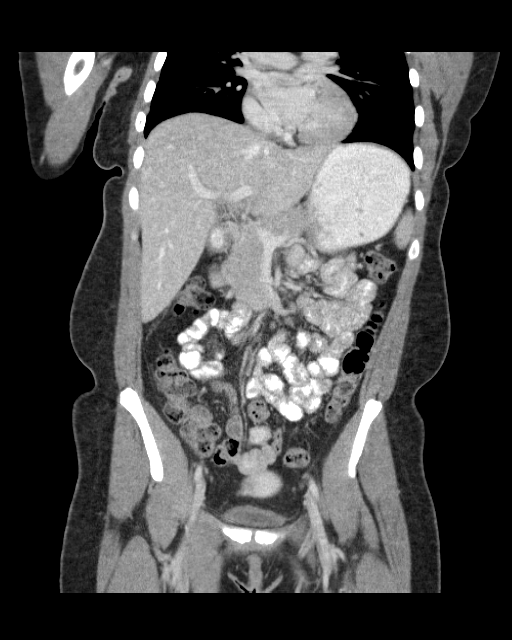
[im 52/94  soft-tissue]
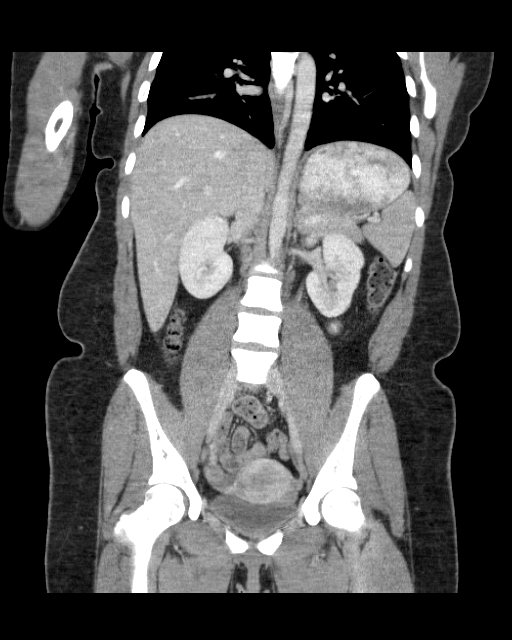

[17 of 46 positions shown; findings below may reference images not displayed]

FINDINGS: Lung bases are clear.  No effusions.  Heart is normal size.

Liver, spleen, pancreas, adrenals and kidneys are normal.
Gallbladder is contracted, grossly unremarkable.

Uterus, adnexae and urinary bladder are unremarkable. Stomach, large
and small bowel grossly unremarkable. Trace free fluid in the
pelvis, likely physiologic. Aorta is normal caliber.
IMPRESSION: No acute findings in the abdomen or pelvis.

## 2016-07-01 ENCOUNTER — Encounter (HOSPITAL_BASED_OUTPATIENT_CLINIC_OR_DEPARTMENT_OTHER): Payer: Self-pay | Admitting: Emergency Medicine

## 2016-07-01 ENCOUNTER — Emergency Department (HOSPITAL_BASED_OUTPATIENT_CLINIC_OR_DEPARTMENT_OTHER)
Admission: EM | Admit: 2016-07-01 | Discharge: 2016-07-02 | Disposition: A | Discharge status: Home / Self Care | Payer: Commercial Managed Care - PPO | Attending: Emergency Medicine | Admitting: Emergency Medicine

## 2016-07-01 DIAGNOSIS — F32A Depression, unspecified: Secondary | ICD-10-CM

## 2016-07-01 DIAGNOSIS — J0100 Acute maxillary sinusitis, unspecified: Secondary | ICD-10-CM | POA: Insufficient documentation

## 2016-07-01 DIAGNOSIS — R739 Hyperglycemia, unspecified: Secondary | ICD-10-CM

## 2016-07-01 DIAGNOSIS — I10 Essential (primary) hypertension: Secondary | ICD-10-CM | POA: Insufficient documentation

## 2016-07-01 DIAGNOSIS — E860 Dehydration: Secondary | ICD-10-CM | POA: Insufficient documentation

## 2016-07-01 DIAGNOSIS — E1165 Type 2 diabetes mellitus with hyperglycemia: Secondary | ICD-10-CM | POA: Insufficient documentation

## 2016-07-01 DIAGNOSIS — F329 Major depressive disorder, single episode, unspecified: Secondary | ICD-10-CM | POA: Insufficient documentation

## 2016-07-01 HISTORY — DX: Depression, unspecified: F32.A

## 2016-07-01 HISTORY — DX: Major depressive disorder, single episode, unspecified: F32.9

## 2016-07-01 LAB — CBC WITH DIFFERENTIAL/PLATELET
BASOS PCT: 0 %
Basophils Absolute: 0 10*3/uL (ref 0.0–0.1)
Eosinophils Absolute: 0.2 10*3/uL (ref 0.0–0.7)
Eosinophils Relative: 2 %
HCT: 40.8 % (ref 36.0–46.0)
Hemoglobin: 13.6 g/dL (ref 12.0–15.0)
LYMPHS ABS: 3.4 10*3/uL (ref 0.7–4.0)
Lymphocytes Relative: 44 %
MCH: 22.6 pg — AB (ref 26.0–34.0)
MCHC: 33.3 g/dL (ref 30.0–36.0)
MCV: 67.9 fL — ABNORMAL LOW (ref 78.0–100.0)
MONO ABS: 0.6 10*3/uL (ref 0.1–1.0)
Monocytes Relative: 8 %
NEUTROS ABS: 3.6 10*3/uL (ref 1.7–7.7)
Neutrophils Relative %: 46 %
Platelets: 332 10*3/uL (ref 150–400)
RBC: 6.01 MIL/uL — ABNORMAL HIGH (ref 3.87–5.11)
RDW: 14.8 % (ref 11.5–15.5)
WBC: 7.8 10*3/uL (ref 4.0–10.5)

## 2016-07-01 LAB — COMPREHENSIVE METABOLIC PANEL
ALT: 24 U/L (ref 14–54)
ANION GAP: 9 (ref 5–15)
AST: 17 U/L (ref 15–41)
Albumin: 4 g/dL (ref 3.5–5.0)
Alkaline Phosphatase: 80 U/L (ref 38–126)
BUN: 10 mg/dL (ref 6–20)
CALCIUM: 8.9 mg/dL (ref 8.9–10.3)
CO2: 24 mmol/L (ref 22–32)
Chloride: 100 mmol/L — ABNORMAL LOW (ref 101–111)
Creatinine, Ser: 0.49 mg/dL (ref 0.44–1.00)
GFR calc Af Amer: >60 mL/min (ref >60)
GFR calc non Af Amer: >60 mL/min (ref >60)
Glucose, Bld: 281 mg/dL — ABNORMAL HIGH (ref 65–99)
POTASSIUM: 3.8 mmol/L (ref 3.5–5.1)
SODIUM: 133 mmol/L — AB (ref 135–145)
Total Bilirubin: 0.6 mg/dL (ref 0.3–1.2)
Total Protein: 7.9 g/dL (ref 6.5–8.1)

## 2016-07-01 LAB — URINALYSIS, ROUTINE W REFLEX MICROSCOPIC
Bilirubin Urine: NEGATIVE
Glucose, UA: >=500 mg/dL — AB
Hgb urine dipstick: NEGATIVE
Ketones, ur: NEGATIVE mg/dL
LEUKOCYTES UA: NEGATIVE
NITRITE: NEGATIVE
PH: 6 (ref 5.0–8.0)
PROTEIN: NEGATIVE mg/dL
Specific Gravity, Urine: >1.046 — ABNORMAL HIGH (ref 1.005–1.030)

## 2016-07-01 LAB — URINALYSIS, MICROSCOPIC (REFLEX)

## 2016-07-01 LAB — PREGNANCY, URINE: Preg Test, Ur: NEGATIVE

## 2016-07-01 MED ORDER — LORATADINE 10 MG PO TABS: 10.0000 mg | ORAL_TABLET | Freq: Every day | ORAL | 0 refills | Status: DC

## 2016-07-01 MED ORDER — LISINOPRIL 2.5 MG PO TABS: 2.5000 mg | ORAL_TABLET | Freq: Every day | ORAL | 0 refills | Status: DC

## 2016-07-01 MED ORDER — ESCITALOPRAM OXALATE 10 MG PO TABS: 10.0000 mg | ORAL_TABLET | Freq: Every day | ORAL | 0 refills | Status: DC

## 2016-07-01 MED ORDER — AZITHROMYCIN 250 MG PO TABS: 250.0000 mg | ORAL_TABLET | Freq: Every day | ORAL | 0 refills | Status: DC

## 2016-07-01 MED ORDER — SODIUM CHLORIDE 0.9 % IV BOLUS (SEPSIS)
2000.0000 mL | Freq: Once | INTRAVENOUS | Status: AC
  Administered 2016-07-01: 2000 mL via INTRAVENOUS

## 2016-07-01 MED ORDER — METFORMIN HCL 500 MG PO TABS: 1000.0000 mg | ORAL_TABLET | Freq: Two times a day (BID) | ORAL | 0 refills | Status: DC

## 2016-07-01 MED ORDER — FLUTICASONE PROPIONATE 50 MCG/ACT NA SUSP: 2.0000 | Freq: Every day | NASAL | 0 refills | Status: DC

## 2016-07-01 NOTE — ED Notes (Signed)
FAST neuro assessment NEGATIVE

## 2016-07-01 NOTE — ED Notes (Signed)
Pt presents today with c/o having a "bad headache" all day, states more on rt side of face to over rt eye, has not taken any over the counter meds, hx HTN, Diabetes, has not been taking these meds either, states does not have a primary MD

## 2016-07-01 NOTE — ED Provider Notes (Signed)
[facility]   CSN: [id number] Arrival date & time: 07/01/16  1750     History   Chief Complaint Chief Complaint  Patient presents with  . Headache    HPI [place] R Droege is a 31 y.o. female.  HPI Patient presents with one day of gradual onset right sided facial pain. Denies any photophobia or visual changes. No nausea or vomiting. No neck pain or stiffness. No fever but endorses chills. States she's had increased fatigue and generalized weakness. Since she's been out of her medication for several months. Endorses depressive thoughts with difficulty sleeping and episodic crying. Denies any suicidal ideation. States she was prescribed venlafaxine in the past but cannot afford. She is asking for refills of her blood pressure and diabetic medications. Past Medical History:  Diagnosis Date  . Depression   . Diabetes mellitus without complication (HCC)   . Hypertension   . Myocardial infarction     Patient Active Problem List   Diagnosis Date Noted  . Black-out (not amnesia) 08/09/2013  . Diabetes (HCC) 05/16/2013  . Stress 04/18/2012  . Insomnia 04/18/2012  . Irregular menses 04/18/2012  . RASH AND OTHER NONSPECIFIC SKIN ERUPTION 11/02/2007    Past Surgical History:  Procedure Laterality Date  . APPENDECTOMY     1989    OB History    No data available       Home Medications    Prior to Admission medications   Medication Sig Start Date End Date Taking? Authorizing Provider  azithromycin (ZITHROMAX) 250 MG tablet Take 1 tablet (250 mg total) by mouth daily. Take 2 tablets the first day and then 1 tablet every day until finished. 07/01/16   Loren Racer, MD  escitalopram (LEXAPRO) 10 MG tablet Take 1 tablet (10 mg total) by mouth daily. 07/01/16   Loren Racer, MD  fluticasone (FLONASE) 50 MCG/ACT nasal spray Place 2 sprays into both nostrils daily. 07/01/16   Loren Racer, MD  lisinopril (PRINIVIL,ZESTRIL) 2.5 MG tablet Take 1 tablet  (2.5 mg total) by mouth daily. 07/01/16   Loren Racer, MD  loratadine (CLARITIN) 10 MG tablet Take 1 tablet (10 mg total) by mouth daily. 07/01/16   Loren Racer, MD  metFORMIN (GLUCOPHAGE) 500 MG tablet Take 2 tablets (1,000 mg total) by mouth 2 (two) times daily with a meal. 07/01/16   Loren Racer, MD  ondansetron (ZOFRAN ODT) 8 MG disintegrating tablet Take 1 tablet (8 mg total) by mouth every 8 (eight) hours as needed for nausea or vomiting. 12/10/13   Margarita Grizzle, MD  oxyCODONE-acetaminophen (PERCOCET/ROXICET) 5-325 MG per tablet Take 1 tablet by mouth every 4 (four) hours as needed for severe pain. 12/10/13   Margarita Grizzle, MD    Family History Family History  Problem Relation Age of Onset  . Diabetes Mother   . Hypertension Father   . Birth defects Maternal Grandfather   . Hypertension Maternal Grandfather   . Hypertension Maternal Grandmother   . Heart disease Paternal Grandmother   . Hypertension Paternal Grandmother   . Hypertension Paternal Grandfather     Social History Social History  Substance Use Topics  . Smoking status: Never Smoker  . Smokeless tobacco: Never Used  . Alcohol use No     Allergies   Patient has no known allergies.   Review of Systems Review of Systems  Constitutional: Positive for chills and fatigue. Negative for appetite change and fever.  HENT: Positive for sinus pain and sinus pressure. Negative for congestion,  dental problem, ear pain, hearing loss and sore throat.   Eyes: Negative for photophobia and visual disturbance.  Respiratory: Negative for cough and shortness of breath.   Cardiovascular: Negative for chest pain, palpitations and leg swelling.  Gastrointestinal: Negative for abdominal pain, constipation, diarrhea, nausea and vomiting.  Genitourinary: Positive for frequency. Negative for difficulty urinating, dysuria, flank pain and pelvic pain.  Musculoskeletal: Negative for back pain, myalgias, neck pain and neck stiffness.    Skin: Negative for rash and wound.  Neurological: Positive for weakness (generalized). Negative for dizziness, light-headedness, numbness and headaches.  All other systems reviewed and are negative.    Physical Exam Updated Vital Signs BP 128/100   Pulse 79   Temp 98.2 F (36.8 C) (Oral)   Resp 14   Ht 5\' 3"  (1.6 m)   Wt 172 lb (78 kg)   LMP 06/22/2016 (Approximate)   SpO2 97%   BMI 30.47 kg/m   Physical Exam  Constitutional: She is oriented to person, place, and time. She appears well-developed and well-nourished. No distress.  HENT:  Head: Normocephalic and atraumatic.  Mouth/Throat: Oropharynx is clear and moist. No oropharyngeal exudate.  Nasal mucosal edema greater than the right and left. Tenderness to percussion over the right maxillary sinus.  Eyes: EOM are normal. Pupils are equal, round, and reactive to light.  Neck: Normal range of motion. Neck supple.  No meningismus  Cardiovascular: Normal rate and regular rhythm.  Exam reveals no gallop and no friction rub.   No murmur heard. Pulmonary/Chest: Effort normal and breath sounds normal. No respiratory distress. She has no wheezes. She has no rales. She exhibits no tenderness.  Abdominal: Soft. Bowel sounds are normal. There is no tenderness. There is no rebound and no guarding.  Musculoskeletal: Normal range of motion. She exhibits no edema or tenderness.  No CVA tenderness bilaterally.  Lymphadenopathy:    She has no cervical adenopathy.  Neurological: She is alert and oriented to person, place, and time.  Moving all extremities without deficit. Sensation is fully intact. Ambulating without difficulty.  Skin: Skin is warm and dry. Capillary refill takes less than 2 seconds. No rash noted. No erythema.  Psychiatric: She has a normal mood and affect. Her behavior is normal.  Nursing note and vitals reviewed.    ED Treatments / Results  Labs (all labs ordered are listed, but only abnormal results are  displayed) Labs Reviewed  CBC WITH DIFFERENTIAL/PLATELET - Abnormal; Notable for the following:       Result Value   RBC 6.01 (*)    MCV 67.9 (*)    MCH 22.6 (*)    All other components within normal limits  COMPREHENSIVE METABOLIC PANEL - Abnormal; Notable for the following:    Sodium 133 (*)    Chloride 100 (*)    Glucose, Bld 281 (*)    All other components within normal limits  URINALYSIS, ROUTINE W REFLEX MICROSCOPIC - Abnormal; Notable for the following:    Specific Gravity, Urine >1.046 (*)    Glucose, UA >=500 (*)    All other components within normal limits  URINALYSIS, MICROSCOPIC (REFLEX) - Abnormal; Notable for the following:    Bacteria, UA RARE (*)    Squamous Epithelial / LPF 0-5 (*)    All other components within normal limits  PREGNANCY, URINE    EKG  EKG Interpretation None       Radiology No results found.  Procedures Procedures (including critical care time)  Medications Ordered in ED  Medications  sodium chloride 0.9 % bolus 2,000 mL (2,000 mLs Intravenous New Bag/Given 07/01/16 2307)     Initial Impression / Assessment and Plan / ED Course  I have reviewed the triage vital signs and the nursing notes.  Pertinent labs & imaging results that were available during my care of the patient were reviewed by me and considered in my medical decision making (see chart for details).     Patient with evidence of right maxillary sinusitis. No pleuritic-like signs or symptoms. Normal neurologic exam. Blood sugar is poorly controlled. Likely dehydration. Given IV fluids in the emergency department. Patient also endorses depressive thoughts. Not suicidal ideation. Will start on SSRI and given outpatient resources for psychiatric follow-up. Return precautions given.  Final Clinical Impressions(s) / ED Diagnoses   Final diagnoses:  Hyperglycemia  Acute maxillary sinusitis, recurrence not specified  Dehydration  Depression, unspecified depression type     New Prescriptions New Prescriptions   ESCITALOPRAM (LEXAPRO) 10 MG TABLET    Take 1 tablet (10 mg total) by mouth daily.   FLUTICASONE (FLONASE) 50 MCG/ACT NASAL SPRAY    Place 2 sprays into both nostrils daily.     Loren Raceravid Jacinto Keil, MD 07/01/16 [id number]

## 2016-07-01 NOTE — ED Triage Notes (Signed)
Headache from right forehead to right ear today.  Has not taken any meds at home. Presents today because she has been off of her diabetes and HTN meds for about 3 years and wants to be seen for it all.

## 2016-07-01 NOTE — ED Notes (Signed)
States appetite has not been well for the past few weeks.

## 2016-07-01 NOTE — ED Notes (Signed)
Gave pt a blanket - no fever

## 2016-09-22 ENCOUNTER — Ambulatory Visit: Payer: Self-pay | Admitting: Nurse Practitioner

## 2016-10-06 ENCOUNTER — Other Ambulatory Visit (INDEPENDENT_AMBULATORY_CARE_PROVIDER_SITE_OTHER): Payer: Self-pay

## 2016-10-06 ENCOUNTER — Ambulatory Visit (INDEPENDENT_AMBULATORY_CARE_PROVIDER_SITE_OTHER): Payer: Self-pay | Admitting: Nurse Practitioner

## 2016-10-06 ENCOUNTER — Encounter: Payer: Self-pay | Admitting: Nurse Practitioner

## 2016-10-06 VITALS — BP 108/78 | HR 78 | Temp 97.8°F | Ht 63.0 in | Wt 170.0 lb

## 2016-10-06 DIAGNOSIS — F411 Generalized anxiety disorder: Secondary | ICD-10-CM | POA: Insufficient documentation

## 2016-10-06 DIAGNOSIS — E118 Type 2 diabetes mellitus with unspecified complications: Secondary | ICD-10-CM

## 2016-10-06 DIAGNOSIS — N926 Irregular menstruation, unspecified: Secondary | ICD-10-CM

## 2016-10-06 DIAGNOSIS — J309 Allergic rhinitis, unspecified: Secondary | ICD-10-CM

## 2016-10-06 DIAGNOSIS — Z13220 Encounter for screening for lipoid disorders: Secondary | ICD-10-CM

## 2016-10-06 DIAGNOSIS — Z0000 Encounter for general adult medical examination without abnormal findings: Secondary | ICD-10-CM

## 2016-10-06 DIAGNOSIS — Z136 Encounter for screening for cardiovascular disorders: Secondary | ICD-10-CM

## 2016-10-06 LAB — COMPREHENSIVE METABOLIC PANEL
ALBUMIN: 4.4 g/dL (ref 3.5–5.2)
ALT: 24 U/L (ref 0–35)
AST: 16 U/L (ref 0–37)
Alkaline Phosphatase: 72 U/L (ref 39–117)
BUN: 10 mg/dL (ref 6–23)
CHLORIDE: 102 meq/L (ref 96–112)
CO2: 27 mEq/L (ref 19–32)
Calcium: 9.6 mg/dL (ref 8.4–10.5)
Creatinine, Ser: 0.75 mg/dL (ref 0.40–1.20)
GFR: 117.04 mL/min (ref >60.00)
Glucose, Bld: 317 mg/dL — ABNORMAL HIGH (ref 70–99)
Potassium: 4.3 mEq/L (ref 3.5–5.1)
SODIUM: 136 meq/L (ref 135–145)
Total Bilirubin: 0.5 mg/dL (ref 0.2–1.2)
Total Protein: 8.1 g/dL (ref 6.0–8.3)

## 2016-10-06 LAB — CBC
HEMATOCRIT: 43.4 % (ref 36.0–46.0)
Hemoglobin: 13.9 g/dL (ref 12.0–15.0)
MCHC: 32 g/dL (ref 30.0–36.0)
MCV: 71.1 fl — ABNORMAL LOW (ref 78.0–100.0)
Platelets: 311 10*3/uL (ref 150.0–400.0)
RBC: 6.11 Mil/uL — ABNORMAL HIGH (ref 3.87–5.11)
RDW: 14.6 % (ref 11.5–15.5)
WBC: 5.1 10*3/uL (ref 4.0–10.5)

## 2016-10-06 LAB — MICROALBUMIN / CREATININE URINE RATIO
CREATININE, U: 88.2 mg/dL
MICROALB UR: 12.4 mg/dL — AB (ref 0.0–1.9)
MICROALB/CREAT RATIO: 14.1 mg/g (ref 0.0–30.0)

## 2016-10-06 LAB — TSH: TSH: 0.57 u[IU]/mL (ref 0.35–4.50)

## 2016-10-06 LAB — LIPID PANEL
CHOLESTEROL: 185 mg/dL (ref 0–200)
HDL: 49.4 mg/dL (ref >39.00)
LDL CALC: 121 mg/dL — AB (ref 0–99)
NonHDL: 135.96
Total CHOL/HDL Ratio: 4
Triglycerides: 77 mg/dL (ref 0.0–149.0)
VLDL: 15.4 mg/dL (ref 0.0–40.0)

## 2016-10-06 LAB — HEMOGLOBIN A1C: HEMOGLOBIN A1C: 12.6 % — AB (ref 4.6–6.5)

## 2016-10-06 MED ORDER — LISINOPRIL 2.5 MG PO TABS: 2.5000 mg | ORAL_TABLET | Freq: Every day | ORAL | 0 refills | Status: DC

## 2016-10-06 MED ORDER — FLUTICASONE PROPIONATE 50 MCG/ACT NA SUSP: 2.0000 | Freq: Every day | NASAL | 3 refills | Status: DC

## 2016-10-06 MED ORDER — METFORMIN HCL 500 MG PO TABS: 1000.0000 mg | ORAL_TABLET | Freq: Two times a day (BID) | ORAL | 0 refills | Status: DC

## 2016-10-06 MED ORDER — ESCITALOPRAM OXALATE 10 MG PO TABS: 10.0000 mg | ORAL_TABLET | Freq: Every day | ORAL | 0 refills | Status: DC

## 2016-10-06 MED ORDER — LORATADINE 10 MG PO TABS: 10.0000 mg | ORAL_TABLET | Freq: Every day | ORAL | 0 refills | Status: DC

## 2016-10-06 NOTE — Progress Notes (Signed)
Subjective:    Patient ID: Christy Rogers, female    DOB: 04-10-1986, 31 y.o.   MRN: [id number]  Patient presents today for complete physical (new patient) and medication refills  HPI  previous pcp with [facility].  Last seen over 2years ago  Diabetes: diagnosis 5years ago. Out of metfomin for 69month. Home glucose: 100-200.  Anxiety: Controlled with lexapro. Denies any adverse side effects.  Immunizations: (TDAP, Hep C screen, Pneumovax, Influenza, zoster)  Health Maintenance  Topic Date Due  . Complete foot exam   03/15/1997  . Eye exam for diabetics  03/15/1997  . HIV Screening  03/15/2002  . Tetanus Vaccine  03/15/2006  . Pap Smear  03/15/2008  . Hemoglobin A1C  10/16/2012  . Flu Shot  01/06/2017  . Pneumococcal vaccine (2) 04/22/2017   Diet:regular Weight:  Wt Readings from Last 3 Encounters:  10/06/16 170 lb 0.6 oz (77.1 kg)  07/01/16 172 lb (78 kg)  04/22/12 161 lb (73 kg)   Exercise:none Fall Risk: Fall Risk  10/06/2016 05/16/2013  Falls in the past year? No No   Home Safety:home with family Depression/Suicide: Depression screen [facility] 2/9 10/06/2016 05/16/2013 04/18/2012  Decreased Interest 0 0 0  Down, Depressed, Hopeless 0 0 0  PHQ - 2 Score 0 0 0   Pap Smear (every 49yrs for >21-29 without HPV, every 31yrs for >30-42yrs with HPV):needed Vision:up to date Dental:up to date Advanced Directive: Advanced Directives 07/01/2016  Does Patient Have a Medical Advance Directive? No  Would patient like information on creating a medical advance directive? No - Patient declined   Sexual History (birth control, marital status, STD):not sexually active at this time, no contraceptive at this time. Irregular mentrual cycle (chronic, every 45-60days, last for 5-7days. No clots)  Medications and allergies reviewed with patient and updated if appropriate.  Patient Active Problem List   Diagnosis Date Noted  . GAD (generalized anxiety disorder) 10/06/2016  .  Allergic rhinitis 10/06/2016  . Moderate major depression (HCC) 01/04/2014  . DM type 2 (diabetes mellitus, type 2) (HCC) 05/16/2013  . Insomnia 04/18/2012  . Irregular menses 04/18/2012    No current outpatient prescriptions on file prior to visit.   No current facility-administered medications on file prior to visit.     Past Medical History:  Diagnosis Date  . Depression   . Diabetes mellitus without complication (HCC)   . Hypertension   . Myocardial infarction [facility])     Past Surgical History:  Procedure Laterality Date  . APPENDECTOMY     1989    Social History   Social History  . Marital status: Single    Spouse name: N/A  . Number of children: N/A  . Years of education: N/A   Social History Main Topics  . Smoking status: Never Smoker  . Smokeless tobacco: Never Used  . Alcohol use No  . Drug use: No  . Sexual activity: No   Other Topics Concern  . None   Social History Narrative  . None    Family History  Problem Relation Age of Onset  . Diabetes Mother   . Hypertension Father   . Birth defects Maternal Grandfather   . Hypertension Maternal Grandfather   . Hypertension Maternal Grandmother   . Heart disease Paternal Grandmother   . Hypertension Paternal Grandmother   . Hypertension Paternal Grandfather         Review of Systems  Constitutional: Negative for fever, malaise/fatigue and weight loss.  HENT:  Negative for congestion and sore throat.   Eyes:       Negative for visual changes  Respiratory: Negative for cough and shortness of breath.   Cardiovascular: Negative for chest pain, palpitations and leg swelling.  Gastrointestinal: Negative for blood in stool, constipation, diarrhea and heartburn.  Genitourinary: Negative for dysuria, frequency and urgency.  Musculoskeletal: Negative for falls, joint pain and myalgias.  Skin: Negative for rash.  Neurological: Negative for dizziness, sensory change and headaches.  Endo/Heme/Allergies:  Does not bruise/bleed easily.  Psychiatric/Behavioral: Negative for depression, substance abuse and suicidal ideas. The patient is not nervous/anxious.     Objective:   Vitals:   10/06/16 0909  BP: 108/78  Pulse: 78  Temp: 97.8 F (36.6 C)    Body mass index is 30.12 kg/m.   Physical Examination:  Physical Exam  Constitutional: She is oriented to person, place, and time and well-developed, well-nourished, and in no distress. No distress.  HENT:  Right Ear: External ear normal.  Left Ear: External ear normal.  Nose: Nose normal.  Mouth/Throat: No oropharyngeal exudate.  Eyes: Conjunctivae and EOM are normal. Pupils are equal, round, and reactive to light. No scleral icterus.  Neck: Normal range of motion. Neck supple. No thyromegaly present.  Cardiovascular: Normal rate, regular rhythm, normal heart sounds and intact distal pulses.   Pulmonary/Chest: Effort normal and breath sounds normal. Right breast exhibits no inverted nipple, no mass, no nipple discharge, no skin change and no tenderness. Left breast exhibits no inverted nipple, no mass, no nipple discharge, no skin change and no tenderness. Breasts are symmetrical.  Abdominal: Soft. Bowel sounds are normal. She exhibits no distension. There is no tenderness.  Genitourinary:  Genitourinary Comments: Declined pelvic exam  Musculoskeletal: Normal range of motion. She exhibits no edema or tenderness.  Lymphadenopathy:    She has no cervical adenopathy.  Neurological: She is alert and oriented to person, place, and time. Gait normal.  Skin: Skin is warm and dry.  Psychiatric: Affect and judgment normal.  Vitals reviewed.   ASSESSMENT and PLAN:  Christy was seen today for new patient (initial visit).  Diagnoses and all orders for this visit:  Encounter for preventative adult health care examination -     CBC; Future -     TSH; Future -     Lipid panel; Future -     Comprehensive metabolic panel; Future  Type 2  diabetes mellitus with complication, without long-term current use of insulin (HCC) -     lisinopril (PRINIVIL,ZESTRIL) 2.5 MG tablet; Take 1 tablet (2.5 mg total) by mouth daily. -     metFORMIN (GLUCOPHAGE) 500 MG tablet; Take 2 tablets (1,000 mg total) by mouth 2 (two) times daily with a meal. -     Hemoglobin A1c; Future -     Microalbumin / creatinine urine ratio; Future  Encounter for lipid screening for cardiovascular disease -     Lipid panel; Future  GAD (generalized anxiety disorder) -     escitalopram (LEXAPRO) 10 MG tablet; Take 1 tablet (10 mg total) by mouth daily.  Allergic rhinitis, unspecified seasonality, unspecified trigger -     fluticasone (FLONASE) 50 MCG/ACT nasal spray; Place 2 sprays into both nostrils daily. -     loratadine (CLARITIN) 10 MG tablet; Take 1 tablet (10 mg total) by mouth daily.  Irregular menses    Irregular menses chronic, every 45-60days, last for 5-7days. No clots)      Follow up: Return in about 3 months (  around 01/06/2017) for DM.  Alysia Penna, NP

## 2016-10-06 NOTE — Patient Instructions (Signed)
Go to basement for blood draw and urine collection. You will be called with results.  Your refills has been sent.  Contact [facility] for TDAP immunization record.  Health Maintenance, Female Adopting a healthy lifestyle and getting preventive care can go a long way to promote health and wellness. Talk with your health care provider about what schedule of regular examinations is right for you. This is a good chance for you to check in with your provider about disease prevention and staying healthy. In between checkups, there are plenty of things you can do on your own. Experts have done a lot of research about which lifestyle changes and preventive measures are most likely to keep you healthy. Ask your health care provider for more information. Weight and diet Eat a healthy diet  Be sure to include plenty of vegetables, fruits, low-fat dairy products, and lean protein.  Do not eat a lot of foods high in solid fats, added sugars, or salt.  Get regular exercise. This is one of the most important things you can do for your health.  Most adults should exercise for at least 150 minutes each week. The exercise should increase your heart rate and make you sweat (moderate-intensity exercise).  Most adults should also do strengthening exercises at least twice a week. This is in addition to the moderate-intensity exercise. Maintain a healthy weight  Body mass index (BMI) is a measurement that can be used to identify possible weight problems. It estimates body fat based on height and weight. Your health care provider can help determine your BMI and help you achieve or maintain a healthy weight.  For females 22 years of age and older:  A BMI below 18.5 is considered underweight.  A BMI of 18.5 to 24.9 is normal.  A BMI of 25 to 29.9 is considered overweight.  A BMI of 30 and above is considered obese. Watch levels of cholesterol and blood lipids  You should start having your blood tested  for lipids and cholesterol at 30 years of age, then have this test every 5 years.  You may need to have your cholesterol levels checked more often if:  Your lipid or cholesterol levels are high.  You are older than 30 years of age.  You are at high risk for heart disease. Cancer screening Lung Cancer  Lung cancer screening is recommended for adults 29-4 years old who are at high risk for lung cancer because of a history of smoking.  A yearly low-dose CT scan of the lungs is recommended for people who:  Currently smoke.  Have quit within the past 15 years.  Have at least a 30-pack-year history of smoking. A pack year is smoking an average of one pack of cigarettes a day for 1 year.  Yearly screening should continue until it has been 15 years since you quit.  Yearly screening should stop if you develop a health problem that would prevent you from having lung cancer treatment. Breast Cancer  Practice breast self-awareness. This means understanding how your breasts normally appear and feel.  It also means doing regular breast self-exams. Let your health care provider know about any changes, no matter how small.  If you are in your 20s or 30s, you should have a clinical breast exam (CBE) by a health care provider every 1-3 years as part of a regular health exam.  If you are 55 or older, have a CBE every year. Also consider having a breast X-ray (mammogram) every year.  If you have a family history of breast cancer, talk to your health care provider about genetic screening.  If you are at high risk for breast cancer, talk to your health care provider about having an MRI and a mammogram every year.  Breast cancer gene (BRCA) assessment is recommended for women who have family members with BRCA-related cancers. BRCA-related cancers include:  Breast.  Ovarian.  Tubal.  Peritoneal cancers.  Results of the assessment will determine the need for genetic counseling and BRCA1 and  BRCA2 testing. Cervical Cancer  Your health care provider may recommend that you be screened regularly for cancer of the pelvic organs (ovaries, uterus, and vagina). This screening involves a pelvic examination, including checking for microscopic changes to the surface of your cervix (Pap test). You may be encouraged to have this screening done every 3 years, beginning at age 21.  For women ages 30-65, health care providers may recommend pelvic exams and Pap testing every 3 years, or they may recommend the Pap and pelvic exam, combined with testing for human papilloma virus (HPV), every 5 years. Some types of HPV increase your risk of cervical cancer. Testing for HPV may also be done on women of any age with unclear Pap test results.  Other health care providers may not recommend any screening for nonpregnant women who are considered low risk for pelvic cancer and who do not have symptoms. Ask your health care provider if a screening pelvic exam is right for you.  If you have had past treatment for cervical cancer or a condition that could lead to cancer, you need Pap tests and screening for cancer for at least 20 years after your treatment. If Pap tests have been discontinued, your risk factors (such as having a new sexual partner) need to be reassessed to determine if screening should resume. Some women have medical problems that increase the chance of getting cervical cancer. In these cases, your health care provider may recommend more frequent screening and Pap tests. Colorectal Cancer  This type of cancer can be detected and often prevented.  Routine colorectal cancer screening usually begins at 30 years of age and continues through 30 years of age.  Your health care provider may recommend screening at an earlier age if you have risk factors for colon cancer.  Your health care provider may also recommend using home test kits to check for hidden blood in the stool.  A small camera at the end  of a tube can be used to examine your colon directly (sigmoidoscopy or colonoscopy). This is done to check for the earliest forms of colorectal cancer.  Routine screening usually begins at age 50.  Direct examination of the colon should be repeated every 5-10 years through 30 years of age. However, you may need to be screened more often if early forms of precancerous polyps or small growths are found. Skin Cancer  Check your skin from head to toe regularly.  Tell your health care provider about any new moles or changes in moles, especially if there is a change in a mole's shape or color.  Also tell your health care provider if you have a mole that is larger than the size of a pencil eraser.  Always use sunscreen. Apply sunscreen liberally and repeatedly throughout the day.  Protect yourself by wearing long sleeves, pants, a wide-brimmed hat, and sunglasses whenever you are outside. Heart disease, diabetes, and high blood pressure  High blood pressure causes heart disease and increases the risk   of stroke. High blood pressure is more likely to develop in:  People who have blood pressure in the high end of the normal range (130-139/85-89 mm Hg).  People who are overweight or obese.  People who are African American.  If you are 18-39 years of age, have your blood pressure checked every 3-5 years. If you are 40 years of age or older, have your blood pressure checked every year. You should have your blood pressure measured twice-once when you are at a hospital or clinic, and once when you are not at a hospital or clinic. Record the average of the two measurements. To check your blood pressure when you are not at a hospital or clinic, you can use:  An automated blood pressure machine at a pharmacy.  A home blood pressure monitor.  If you are between 55 years and 79 years old, ask your health care provider if you should take aspirin to prevent strokes.  Have regular diabetes screenings.  This involves taking a blood sample to check your fasting blood sugar level.  If you are at a normal weight and have a low risk for diabetes, have this test once every three years after 30 years of age.  If you are overweight and have a high risk for diabetes, consider being tested at a younger age or more often. Preventing infection Hepatitis B  If you have a higher risk for hepatitis B, you should be screened for this virus. You are considered at high risk for hepatitis B if:  You were born in a country where hepatitis B is common. Ask your health care provider which countries are considered high risk.  Your parents were born in a high-risk country, and you have not been immunized against hepatitis B (hepatitis B vaccine).  You have HIV or AIDS.  You use needles to inject street drugs.  You live with someone who has hepatitis B.  You have had sex with someone who has hepatitis B.  You get hemodialysis treatment.  You take certain medicines for conditions, including cancer, organ transplantation, and autoimmune conditions. Hepatitis C  Blood testing is recommended for:  Everyone born from 1945 through 1965.  Anyone with known risk factors for hepatitis C. Sexually transmitted infections (STIs)  You should be screened for sexually transmitted infections (STIs) including gonorrhea and chlamydia if:  You are sexually active and are younger than 30 years of age.  You are older than 30 years of age and your health care provider tells you that you are at risk for this type of infection.  Your sexual activity has changed since you were last screened and you are at an increased risk for chlamydia or gonorrhea. Ask your health care provider if you are at risk.  If you do not have HIV, but are at risk, it may be recommended that you take a prescription medicine daily to prevent HIV infection. This is called pre-exposure prophylaxis (PrEP). You are considered at risk if:  You are  sexually active and do not regularly use condoms or know the HIV status of your partner(s).  You take drugs by injection.  You are sexually active with a partner who has HIV. Talk with your health care provider about whether you are at high risk of being infected with HIV. If you choose to begin PrEP, you should first be tested for HIV. You should then be tested every 3 months for as long as you are taking PrEP. Pregnancy  If you are   premenopausal and you may become pregnant, ask your health care provider about preconception counseling.  If you may become pregnant, take 400 to 800 micrograms (mcg) of folic acid every day.  If you want to prevent pregnancy, talk to your health care provider about birth control (contraception). Osteoporosis and menopause  Osteoporosis is a disease in which the bones lose minerals and strength with aging. This can result in serious bone fractures. Your risk for osteoporosis can be identified using a bone density scan.  If you are 45 years of age or older, or if you are at risk for osteoporosis and fractures, ask your health care provider if you should be screened.  Ask your health care provider whether you should take a calcium or vitamin D supplement to lower your risk for osteoporosis.  Menopause may have certain physical symptoms and risks.  Hormone replacement therapy may reduce some of these symptoms and risks. Talk to your health care provider about whether hormone replacement therapy is right for you. Follow these instructions at home:  Schedule regular health, dental, and eye exams.  Stay current with your immunizations.  Do not use any tobacco products including cigarettes, chewing tobacco, or electronic cigarettes.  If you are pregnant, do not drink alcohol.  If you are breastfeeding, limit how much and how often you drink alcohol.  Limit alcohol intake to no more than 1 drink per day for nonpregnant women. One drink equals 12 ounces of  beer, 5 ounces of wine, or 1 ounces of hard liquor.  Do not use street drugs.  Do not share needles.  Ask your health care provider for help if you need support or information about quitting drugs.  Tell your health care provider if you often feel depressed.  Tell your health care provider if you have ever been abused or do not feel safe at home. This information is not intended to replace advice given to you by your health care provider. Make sure you discuss any questions you have with your health care provider. Document Released: 12/08/2010 Document Revised: 10/31/2015 Document Reviewed: 02/26/2015 Elsevier Interactive Patient Education  2017 [employer].

## 2016-10-06 NOTE — Assessment & Plan Note (Signed)
chronic, every 45-60days, last for 5-7days. No clots)

## 2016-10-06 NOTE — Progress Notes (Signed)
Pre visit review using our clinic review tool, if applicable. No additional management support is needed unless otherwise documented below in the visit note. 

## 2016-10-07 MED ORDER — PEN NEEDLES 31G X 6 MM MISC: 1.0000 "application " | Freq: Every day | 2 refills | Status: DC

## 2016-10-07 MED ORDER — INSULIN GLARGINE 100 UNIT/ML SOLOSTAR PEN: 10.0000 [IU] | PEN_INJECTOR | Freq: Every day | SUBCUTANEOUS | 3 refills | Status: DC

## 2016-11-09 ENCOUNTER — Ambulatory Visit: Payer: Self-pay | Admitting: Nurse Practitioner

## 2016-11-10 ENCOUNTER — Telehealth: Payer: Self-pay | Admitting: Nurse Practitioner

## 2016-11-10 NOTE — Telephone Encounter (Signed)
Leave upcoming appt for now.

## 2016-11-10 NOTE — Telephone Encounter (Signed)
Pt no showed for her appointment on 11/09/2016 at 8:15am for a 1 month follow up/DM. She does have a 3 month follow up scheduled on 01/07/2017. How would you like to proceed?

## 2016-12-29 ENCOUNTER — Ambulatory Visit: Payer: Self-pay | Admitting: Nurse Practitioner

## 2017-01-07 ENCOUNTER — Ambulatory Visit: Payer: Self-pay | Admitting: Nurse Practitioner

## 2017-03-30 ENCOUNTER — Ambulatory Visit: Payer: Self-pay | Admitting: Nurse Practitioner

## 2017-04-06 ENCOUNTER — Ambulatory Visit: Payer: Self-pay | Admitting: Nurse Practitioner

## 2017-04-12 ENCOUNTER — Encounter: Payer: Self-pay | Admitting: Nurse Practitioner

## 2017-04-12 ENCOUNTER — Ambulatory Visit (INDEPENDENT_AMBULATORY_CARE_PROVIDER_SITE_OTHER): Payer: Self-pay | Admitting: Nurse Practitioner

## 2017-04-12 VITALS — BP 120/90 | HR 93 | Temp 98.0°F | Ht 63.0 in | Wt 164.0 lb

## 2017-04-12 DIAGNOSIS — Z794 Long term (current) use of insulin: Secondary | ICD-10-CM

## 2017-04-12 DIAGNOSIS — E118 Type 2 diabetes mellitus with unspecified complications: Secondary | ICD-10-CM

## 2017-04-12 DIAGNOSIS — F411 Generalized anxiety disorder: Secondary | ICD-10-CM

## 2017-04-12 DIAGNOSIS — F321 Major depressive disorder, single episode, moderate: Secondary | ICD-10-CM

## 2017-04-12 LAB — POCT GLYCOSYLATED HEMOGLOBIN (HGB A1C): Hemoglobin A1C: 13

## 2017-04-12 MED ORDER — "INSULIN SYRINGE 31G X 5/16"" 0.5 ML MISC": 1.0000 [IU] | Freq: Every day | 3 refills | Status: DC

## 2017-04-12 MED ORDER — LISINOPRIL 2.5 MG PO TABS: 2.5000 mg | ORAL_TABLET | Freq: Every day | ORAL | 3 refills | Status: DC

## 2017-04-12 MED ORDER — GLYBURIDE 5 MG PO TABS: 5.0000 mg | ORAL_TABLET | Freq: Every day | ORAL | 1 refills | Status: DC

## 2017-04-12 MED ORDER — INSULIN GLARGINE 100 UNIT/ML ~~LOC~~ SOLN: 15.0000 [IU] | Freq: Every day | SUBCUTANEOUS | 11 refills | Status: DC

## 2017-04-12 MED ORDER — INSULIN GLARGINE 100 UNIT/ML ~~LOC~~ SOLN: 20.0000 [IU] | Freq: Every day | SUBCUTANEOUS | 11 refills | Status: DC

## 2017-04-12 MED ORDER — METFORMIN HCL 500 MG PO TABS: 1000.0000 mg | ORAL_TABLET | Freq: Two times a day (BID) | ORAL | 1 refills | Status: DC

## 2017-04-12 NOTE — Assessment & Plan Note (Signed)
lexapro d/c by patient. She states she does not need medication at this time.

## 2017-04-12 NOTE — Assessment & Plan Note (Signed)
lexapro discontinued by patient

## 2017-04-12 NOTE — Assessment & Plan Note (Signed)
Uncontrolled with Hgb A1c 13.0. Resumed lantus 15units and metformin. Added glyburide. Lisinopril for renal protection. Needed annual eye exam. F/up in 1week. Consider adding bydureon or victoza? BMP Latest Ref Rng & Units 10/06/2016 07/01/2016 12/09/2013  Glucose 70 - 99 mg/dL 119(J317(H) 478(G281(H) 956(O131(H)  BUN 6 - 23 mg/dL 10 10 15   Creatinine 0.40 - 1.20 mg/dL 1.300.75 8.650.49 7.840.60  Sodium 135 - 145 mEq/L 136 133(L) 141  Potassium 3.5 - 5.1 mEq/L 4.3 3.8 3.9  Chloride 96 - 112 mEq/L 102 100(L) 104  CO2 19 - 32 mEq/L 27 24 22   Calcium 8.4 - 10.5 mg/dL 9.6 8.9 9.5

## 2017-04-12 NOTE — Progress Notes (Signed)
Subjective:  Patient ID: [place] R Spranger, female    DOB: 06/10/1985  Age: 31 y.o. MRN: [id number]  CC: Follow-up (need check up make sure everything if clear for physical exam for police exam) and Medication Refill (DM med consult--no insurance--need to see if something cheaper. )   HPI  No insurance coverage at this time, hence has difficulty getting medications. Has been out of all medications for over 14month.  DM: Home glucose: 150s-200s. Does not check glucose daily. Has not made changes to diet Needs eye exam. She will schedule  Depression: Denies need for lexapro at this time. Has been out of medication for several months. Denies any SI or HI.  Outpatient Medications Prior to Visit  Medication Sig Dispense Refill  . escitalopram (LEXAPRO) 10 MG tablet Take 1 tablet (10 mg total) by mouth daily. 90 tablet 0  . Insulin Glargine (LANTUS) 100 UNIT/ML Solostar Pen Inject 10 Units into the skin daily at 10 pm. 3 pen 3  . Insulin Pen Needle (PEN NEEDLES) 31G X 6 MM MISC 1 application by Does not apply route at bedtime. 100 each 2  . lisinopril (PRINIVIL,ZESTRIL) 2.5 MG tablet Take 1 tablet (2.5 mg total) by mouth daily. 90 tablet 0  . fluticasone (FLONASE) 50 MCG/ACT nasal spray Place 2 sprays into both nostrils daily. (Patient not taking: Reported on 04/12/2017) 16 g 3  . loratadine (CLARITIN) 10 MG tablet Take 1 tablet (10 mg total) by mouth daily. (Patient not taking: Reported on 04/12/2017) 90 tablet 0  . metFORMIN (GLUCOPHAGE) 500 MG tablet Take 2 tablets (1,000 mg total) by mouth 2 (two) times daily with a meal. 360 tablet 0   No facility-administered medications prior to visit.     ROS See HPI  Objective:  BP 120/90   Pulse 93   Temp 98 F (36.7 C)   Ht 5\' 3"  (1.6 m)   Wt 164 lb (74.4 kg)   SpO2 98%   BMI 29.05 kg/m   BP Readings from Last 3 Encounters:  04/12/17 120/90  10/06/16 108/78  07/01/16 128/100    Wt Readings from Last 3 Encounters:  04/12/17 164  lb (74.4 kg)  10/06/16 170 lb 0.6 oz (77.1 kg)  07/01/16 172 lb (78 kg)    Physical Exam  Constitutional: She is oriented to person, place, and time. No distress.  Cardiovascular: Normal rate and regular rhythm.  Pulmonary/Chest: Effort normal and breath sounds normal.  Musculoskeletal: She exhibits no edema or tenderness.  Neurological: She is alert and oriented to person, place, and time.  Foot exam completed  Skin: Skin is warm and dry.  Psychiatric: She has a normal mood and affect. Her behavior is normal.  Vitals reviewed.   Lab Results  Component Value Date   WBC 5.1 10/06/2016   HGB 13.9 10/06/2016   HCT 43.4 10/06/2016   PLT 311.0 10/06/2016   GLUCOSE 317 (H) 10/06/2016   CHOL 185 10/06/2016   TRIG 77.0 10/06/2016   HDL 49.40 10/06/2016   LDLCALC 121 (H) 10/06/2016   ALT 24 10/06/2016   AST 16 10/06/2016   NA 136 10/06/2016   K 4.3 10/06/2016   CL 102 10/06/2016   CREATININE 0.75 10/06/2016   BUN 10 10/06/2016   CO2 27 10/06/2016   TSH 0.57 10/06/2016   HGBA1C 13% 04/12/2017   MICROALBUR 12.4 (H) 10/06/2016    Assessment & Plan:   [place] was seen today for follow-up and medication refill.  Diagnoses and all orders  for this visit:  Type 2 diabetes mellitus with complication, with long-term current use of insulin (HCC) -     POCT glycosylated hemoglobin (Hb A1C) -     metFORMIN (GLUCOPHAGE) 500 MG tablet; Take 2 tablets (1,000 mg total) 2 (two) times daily with a meal by mouth. -     lisinopril (PRINIVIL,ZESTRIL) 2.5 MG tablet; Take 1 tablet (2.5 mg total) daily by mouth. -     Insulin Syringe-Needle U-100 (INSULIN SYRINGE .5CC/31GX5/16") 31G X 5/16" 0.5 ML MISC; 1 Units at bedtime by Does not apply route. -     glyBURIDE (DIABETA) 5 MG tablet; Take 1 tablet (5 mg total) daily with breakfast by mouth. -     insulin glargine (LANTUS) 100 UNIT/ML injection; Inject 0.15 mLs (15 Units total) at bedtime into the skin.  Moderate major depression (HCC)  GAD  (generalized anxiety disorder)  Other orders -     Discontinue: insulin glargine (LANTUS) 100 UNIT/ML injection; Inject 0.2 mLs (20 Units total) at bedtime into the skin.   I have discontinued [place]. Co[place]'s fluticasone, loratadine, escitalopram, Insulin Glargine, and Pen Needles. I have also changed her metFORMIN, lisinopril, and insulin glargine. Additionally, I am having her start on INSULIN SYRINGE .5CC/31GX5/16" and glyBURIDE.  Meds ordered this encounter  Medications  . metFORMIN (GLUCOPHAGE) 500 MG tablet    Sig: Take 2 tablets (1,000 mg total) 2 (two) times daily with a meal by mouth.    Dispense:  360 tablet    Refill:  1    Order Specific Question:   Supervising Provider    Answer:   Tresa Garter [[id number]]  . lisinopril (PRINIVIL,ZESTRIL) 2.5 MG tablet    Sig: Take 1 tablet (2.5 mg total) daily by mouth.    Dispense:  90 tablet    Refill:  3    Order Specific Question:   Supervising Provider    Answer:   Tresa Garter [[id number]]  . DISCONTD: insulin glargine (LANTUS) 100 UNIT/ML injection    Sig: Inject 0.2 mLs (20 Units total) at bedtime into the skin.    Dispense:  10 mL    Refill:  11    Order Specific Question:   Supervising Provider    Answer:   Tresa Garter [[id number]]  . Insulin Syringe-Needle U-100 (INSULIN SYRINGE .5CC/31GX5/16") 31G X 5/16" 0.5 ML MISC    Sig: 1 Units at bedtime by Does not apply route.    Dispense:  100 each    Refill:  3    Order Specific Question:   Supervising Provider    Answer:   Tresa Garter [[id number]]  . glyBURIDE (DIABETA) 5 MG tablet    Sig: Take 1 tablet (5 mg total) daily with breakfast by mouth.    Dispense:  90 tablet    Refill:  1    Order Specific Question:   Supervising Provider    Answer:   Tresa Garter [[id number]]  . insulin glargine (LANTUS) 100 UNIT/ML injection    Sig: Inject 0.15 mLs (15 Units total) at bedtime into the skin.    Dispense:  10 mL    Refill:  11    Order Specific Question:    Supervising Provider    Answer:   Tresa Garter [[id number]]   Follow-up: Return in about 1 week (around 04/19/2017) for DM.  Alysia Penna, NP

## 2017-04-12 NOTE — Patient Instructions (Addendum)
Increase lantus to 15units.  Resume metformin Start glyburide.  Check glucose twice a day (before breakfast and at bedtime) Bring glucose readings to next office vist.  Diabetes Mellitus and Food It is important for you to manage your blood sugar (glucose) level. Your blood glucose level can be greatly affected by what you eat. Eating healthier foods in the appropriate amounts throughout the day at about the same time each day will help you control your blood glucose level. It can also help slow or prevent worsening of your diabetes mellitus. Healthy eating may even help you improve the level of your blood pressure and reach or maintain a healthy weight. General recommendations for healthful eating and cooking habits include:  Eating meals and snacks regularly. Avoid going long periods of time without eating to lose weight.  Eating a diet that consists mainly of plant-based foods, such as fruits, vegetables, nuts, legumes, and whole grains.  Using low-heat cooking methods, such as baking, instead of high-heat cooking methods, such as deep frying.  Work with your dietitian to make sure you understand how to use the Nutrition Facts information on food labels. How can food affect me? Carbohydrates Carbohydrates affect your blood glucose level more than any other type of food. Your dietitian will help you determine how many carbohydrates to eat at each meal and teach you how to count carbohydrates. Counting carbohydrates is important to keep your blood glucose at a healthy level, especially if you are using insulin or taking certain medicines for diabetes mellitus. Alcohol Alcohol can cause sudden decreases in blood glucose (hypoglycemia), especially if you use insulin or take certain medicines for diabetes mellitus. Hypoglycemia can be a life-threatening condition. Symptoms of hypoglycemia (sleepiness, dizziness, and disorientation) are similar to symptoms of having too much alcohol. If your  health care provider has given you approval to drink alcohol, do so in moderation and use the following guidelines:  Women should not have more than one drink per day, and men should not have more than two drinks per day. One drink is equal to: ? 12 oz of beer. ? 5 oz of wine. ? 1 oz of hard liquor.  Do not drink on an empty stomach.  Keep yourself hydrated. Have water, diet soda, or unsweetened iced tea.  Regular soda, juice, and other mixers might contain a lot of carbohydrates and should be counted.  What foods are not recommended? As you make food choices, it is important to remember that all foods are not the same. Some foods have fewer nutrients per serving than other foods, even though they might have the same number of calories or carbohydrates. It is difficult to get your body what it needs when you eat foods with fewer nutrients. Examples of foods that you should avoid that are high in calories and carbohydrates but low in nutrients include:  Trans fats (most processed foods list trans fats on the Nutrition Facts label).  Regular soda.  Juice.  Candy.  Sweets, such as cake, pie, doughnuts, and cookies.  Fried foods.  What foods can I eat? Eat nutrient-rich foods, which will nourish your body and keep you healthy. The food you should eat also will depend on several factors, including:  The calories you need.  The medicines you take.  Your weight.  Your blood glucose level.  Your blood pressure level.  Your cholesterol level.  You should eat a variety of foods, including:  Protein. ? Lean cuts of meat. ? Proteins low in saturated fats,  such as fish, egg whites, and beans. Avoid processed meats.  Fruits and vegetables. ? Fruits and vegetables that may help control blood glucose levels, such as apples, mangoes, and yams.  Dairy products. ? Choose fat-free or low-fat dairy products, such as milk, yogurt, and cheese.  Grains, bread, pasta, and  rice. ? Choose whole grain products, such as multigrain bread, whole oats, and brown rice. These foods may help control blood pressure.  Fats. ? Foods containing healthful fats, such as nuts, avocado, olive oil, canola oil, and fish.  Does everyone with diabetes mellitus have the same meal plan? Because every person with diabetes mellitus is different, there is not one meal plan that works for everyone. It is very important that you meet with a dietitian who will help you create a meal plan that is just right for you. This information is not intended to replace advice given to you by your health care provider. Make sure you discuss any questions you have with your health care provider. Document Released: 02/19/2005 Document Revised: 10/31/2015 Document Reviewed: 04/21/2013 Elsevier Interactive Patient Education  2017 [employer]

## 2017-05-11 ENCOUNTER — Other Ambulatory Visit: Payer: Self-pay

## 2017-05-11 ENCOUNTER — Encounter (HOSPITAL_COMMUNITY): Payer: Self-pay | Admitting: Emergency Medicine

## 2017-05-11 DIAGNOSIS — Z5321 Procedure and treatment not carried out due to patient leaving prior to being seen by health care provider: Secondary | ICD-10-CM | POA: Insufficient documentation

## 2017-05-11 LAB — COMPREHENSIVE METABOLIC PANEL
ALBUMIN: 4.3 g/dL (ref 3.5–5.0)
ALT: 26 U/L (ref 14–54)
ANION GAP: 8 (ref 5–15)
AST: 23 U/L (ref 15–41)
Alkaline Phosphatase: 61 U/L (ref 38–126)
BUN: 11 mg/dL (ref 6–20)
CALCIUM: 9.4 mg/dL (ref 8.9–10.3)
CO2: 27 mmol/L (ref 22–32)
CREATININE: 0.6 mg/dL (ref 0.44–1.00)
Chloride: 104 mmol/L (ref 101–111)
GFR calc Af Amer: >60 mL/min (ref >60)
GFR calc non Af Amer: >60 mL/min (ref >60)
Glucose, Bld: 102 mg/dL — ABNORMAL HIGH (ref 65–99)
Potassium: 3.9 mmol/L (ref 3.5–5.1)
SODIUM: 139 mmol/L (ref 135–145)
TOTAL PROTEIN: 8.1 g/dL (ref 6.5–8.1)
Total Bilirubin: 0.5 mg/dL (ref 0.3–1.2)

## 2017-05-11 LAB — LIPASE, BLOOD: LIPASE: 41 U/L (ref 11–51)

## 2017-05-11 LAB — I-STAT BETA HCG BLOOD, ED (MC, WL, AP ONLY)

## 2017-05-11 LAB — CBC
HCT: 37.2 % (ref 36.0–46.0)
HEMOGLOBIN: 12.1 g/dL (ref 12.0–15.0)
MCH: 22.7 pg — ABNORMAL LOW (ref 26.0–34.0)
MCHC: 32.5 g/dL (ref 30.0–36.0)
MCV: 69.8 fL — ABNORMAL LOW (ref 78.0–100.0)
PLATELETS: 321 10*3/uL (ref 150–400)
RBC: 5.33 MIL/uL — AB (ref 3.87–5.11)
RDW: 13.5 % (ref 11.5–15.5)
WBC: 9.2 10*3/uL (ref 4.0–10.5)

## 2017-05-11 NOTE — ED Notes (Signed)
Pt called from lobby, no responce 

## 2017-05-11 NOTE — ED Triage Notes (Signed)
Pt states she started having pain in her left lower quadrant about 4-5 hrs ago  Since the pain has moved up into her left side and now she is having pain all over in her abdomen  Nausea without vomiting  Pt states pain comes and goes and is sharp in nature

## 2017-05-12 ENCOUNTER — Emergency Department (HOSPITAL_COMMUNITY)
Admission: EM | Admit: 2017-05-12 | Discharge: 2017-05-12 | Discharge status: Against Medical Advice | Payer: Self-pay | Attending: Emergency Medicine | Admitting: Emergency Medicine

## 2017-05-12 NOTE — ED Notes (Signed)
Called  No response from lobby 

## 2017-07-20 ENCOUNTER — Emergency Department (HOSPITAL_COMMUNITY)
Admission: EM | Admit: 2017-07-20 | Discharge: 2017-07-20 | Disposition: A | Discharge status: Home / Self Care | Payer: Self-pay | Attending: Emergency Medicine | Admitting: Emergency Medicine

## 2017-07-20 ENCOUNTER — Encounter (HOSPITAL_COMMUNITY): Payer: Self-pay | Admitting: Emergency Medicine

## 2017-07-20 DIAGNOSIS — J0100 Acute maxillary sinusitis, unspecified: Secondary | ICD-10-CM

## 2017-07-20 DIAGNOSIS — J029 Acute pharyngitis, unspecified: Secondary | ICD-10-CM | POA: Insufficient documentation

## 2017-07-20 DIAGNOSIS — E119 Type 2 diabetes mellitus without complications: Secondary | ICD-10-CM | POA: Insufficient documentation

## 2017-07-20 DIAGNOSIS — I10 Essential (primary) hypertension: Secondary | ICD-10-CM | POA: Insufficient documentation

## 2017-07-20 DIAGNOSIS — Z794 Long term (current) use of insulin: Secondary | ICD-10-CM | POA: Insufficient documentation

## 2017-07-20 DIAGNOSIS — I252 Old myocardial infarction: Secondary | ICD-10-CM | POA: Insufficient documentation

## 2017-07-20 LAB — RAPID STREP SCREEN (MED CTR MEBANE ONLY): Streptococcus, Group A Screen (Direct): NEGATIVE

## 2017-07-20 MED ORDER — AMOXICILLIN 500 MG PO CAPS: 500.0000 mg | ORAL_CAPSULE | Freq: Three times a day (TID) | ORAL | 0 refills | Status: DC

## 2017-07-20 NOTE — Discharge Instructions (Signed)
Return if any problems.

## 2017-07-20 NOTE — ED Notes (Signed)
Bed: WTR5 Expected date:  Expected time:  Means of arrival:  Comments: 

## 2017-07-20 NOTE — ED Provider Notes (Signed)
[facility]-EMERGENCY DEPT Provider Note   CSN: [id number] Arrival date & time: 07/20/17  7829     History   Chief Complaint Chief Complaint  Patient presents with  . URI    HPI Christy Rogers is a 32 y.o. female.  The history is provided by the patient. No language interpreter was used.  URI   This is a new problem. The current episode started 2 days ago. The problem has not changed since onset.There has been no fever. Associated symptoms include sinus pain and sore throat. She has tried nothing for the symptoms. The treatment provided no relief.  Pt complains of sinus drainage.  Pt reports she has had congestion for over a week.   Past Medical History:  Diagnosis Date  . Depression   . Diabetes mellitus without complication (HCC)   . Hypertension   . Myocardial infarction [facility])     Patient Active Problem List   Diagnosis Date Noted  . GAD (generalized anxiety disorder) 10/06/2016  . Allergic rhinitis 10/06/2016  . Moderate major depression (HCC) 01/04/2014  . DM type 2 (diabetes mellitus, type 2) (HCC) 05/16/2013  . Insomnia 04/18/2012  . Irregular menses 04/18/2012    Past Surgical History:  Procedure Laterality Date  . APPENDECTOMY     1989    OB History    No data available       Home Medications    Prior to Admission medications   Medication Sig Start Date End Date Taking? Authorizing Provider  amoxicillin (AMOXIL) 500 MG capsule Take 1 capsule (500 mg total) by mouth 3 (three) times daily. 07/20/17   Elson Areas, PA-C  glyBURIDE (DIABETA) 5 MG tablet Take 1 tablet (5 mg total) daily with breakfast by mouth. 04/12/17   Nche, Bonna Gains, NP  insulin glargine (LANTUS) 100 UNIT/ML injection Inject 0.15 mLs (15 Units total) at bedtime into the skin. 04/12/17   Nche, Bonna Gains, NP  Insulin Syringe-Needle U-100 (INSULIN SYRINGE .5CC/31GX5/16") 31G X 5/16" 0.5 ML MISC 1 Units at bedtime by Does not apply route. 04/12/17   Nche,  Bonna Gains, NP  lisinopril (PRINIVIL,ZESTRIL) 2.5 MG tablet Take 1 tablet (2.5 mg total) daily by mouth. 04/12/17   Nche, Bonna Gains, NP  metFORMIN (GLUCOPHAGE) 500 MG tablet Take 2 tablets (1,000 mg total) 2 (two) times daily with a meal by mouth. 04/12/17 07/11/17  Nche, Bonna Gains, NP    Family History Family History  Problem Relation Age of Onset  . Diabetes Mother   . Hypertension Father   . Birth defects Maternal Grandfather   . Hypertension Maternal Grandfather   . Hypertension Maternal Grandmother   . Heart disease Paternal Grandmother   . Hypertension Paternal Grandmother   . Hypertension Paternal Grandfather   . Diabetes Brother   . Cancer Brother     Social History Social History   Tobacco Use  . Smoking status: Never Smoker  . Smokeless tobacco: Never Used  Substance Use Topics  . Alcohol use: No    Alcohol/week: 0.6 oz    Types: 1 Cans of beer per week  . Drug use: No     Allergies   Patient has no known allergies.   Review of Systems Review of Systems  HENT: Positive for sinus pain and sore throat.   All other systems reviewed and are negative.    Physical Exam Updated Vital Signs BP 132/80   Pulse (!) 108   Temp 98.6 F (37 C)  Resp 16   LMP 06/26/2017   SpO2 99%   Physical Exam  Constitutional: She appears well-developed and well-nourished. No distress.  HENT:  Head: Normocephalic and atraumatic.  Right Ear: External ear normal.  Left Ear: External ear normal.  Nose: Nose normal.  Tender maxillary sinuses bilat   Eyes: Conjunctivae are normal.  Neck: Neck supple.  Cardiovascular: Normal rate and regular rhythm.  No murmur heard. Pulmonary/Chest: Effort normal and breath sounds normal. No respiratory distress.  Abdominal: Soft. There is no tenderness.  Musculoskeletal: She exhibits no edema.  Neurological: She is alert.  Skin: Skin is warm and dry.  Psychiatric: She has a normal mood and affect.  Nursing note and vitals  reviewed.    ED Treatments / Results  Labs (all labs ordered are listed, but only abnormal results are displayed) Labs Reviewed  RAPID STREP SCREEN (NOT AT [facility])  CULTURE, GROUP A STREP [facility]    EKG  EKG Interpretation None       Radiology No results found.  Procedures Procedures (including critical care time)  Medications Ordered in ED Medications - No data to display   Initial Impression / Assessment and Plan / ED Course  I have reviewed the triage vital signs and the nursing notes.  Pertinent labs & imaging results that were available during my care of the patient were reviewed by me and considered in my medical decision making (see chart for details).     MDM:  Pt has a negative strep screen.  I suspect sinusitis due to drainage and congestion.    Final Clinical Impressions(s) / ED Diagnoses   Final diagnoses:  Acute maxillary sinusitis, recurrence not specified    ED Discharge Orders        Ordered    amoxicillin (AMOXIL) 500 MG capsule  3 times daily     07/20/17 1059    An After Visit Summary was printed and given to the patient.   Osie CheeksSofia, Leslie K, PA-C 07/20/17 1129    Lorre NickAllen, Anthony, MD 07/20/17 [phone]

## 2017-07-20 NOTE — ED Triage Notes (Signed)
Per pt, states cold symptoms, sore throat for the last 2 days-no relief with OTC meds-

## 2017-07-22 LAB — CULTURE, GROUP A STREP (THRC)

## 2017-12-20 ENCOUNTER — Telehealth: Payer: Self-pay | Admitting: Nurse Practitioner

## 2017-12-20 DIAGNOSIS — Z794 Long term (current) use of insulin: Principal | ICD-10-CM

## 2017-12-20 DIAGNOSIS — E118 Type 2 diabetes mellitus with unspecified complications: Secondary | ICD-10-CM

## 2017-12-20 MED ORDER — LISINOPRIL 2.5 MG PO TABS
2.5000 mg | ORAL_TABLET | Freq: Every day | ORAL | 0 refills | Status: DC
Start: 2017-12-20 — End: 2018-12-29

## 2017-12-20 MED ORDER — METFORMIN HCL 500 MG PO TABS: 1000.0000 mg | ORAL_TABLET | Freq: Two times a day (BID) | ORAL | 0 refills | Status: DC

## 2017-12-20 NOTE — Telephone Encounter (Signed)
Rx sent. Pt aware.  

## 2017-12-20 NOTE — Telephone Encounter (Signed)
Pt is scheduled to transfer 8/5.  Is requesting refills on metformin and lisinopril until this date.  Please use pharmacy on file.

## 2018-01-10 ENCOUNTER — Encounter: Payer: Self-pay | Admitting: Nurse Practitioner

## 2018-01-20 ENCOUNTER — Encounter: Payer: Self-pay | Admitting: Nurse Practitioner

## 2018-01-20 DIAGNOSIS — Z0289 Encounter for other administrative examinations: Secondary | ICD-10-CM

## 2018-04-22 ENCOUNTER — Encounter: Payer: Self-pay | Admitting: Nurse Practitioner

## 2018-04-22 DIAGNOSIS — Z0289 Encounter for other administrative examinations: Secondary | ICD-10-CM

## 2018-07-12 ENCOUNTER — Telehealth: Payer: Self-pay | Admitting: Internal Medicine

## 2018-07-12 NOTE — Telephone Encounter (Signed)
Called patient to inform on how to become a new patient with Dr.Gherghe.

## 2018-08-23 ENCOUNTER — Emergency Department (HOSPITAL_COMMUNITY)
Admission: EM | Admit: 2018-08-23 | Discharge: 2018-08-23 | Disposition: A | Discharge status: Home / Self Care | Payer: Self-pay | Attending: Emergency Medicine | Admitting: Emergency Medicine

## 2018-08-23 ENCOUNTER — Encounter (HOSPITAL_COMMUNITY): Payer: Self-pay | Admitting: Emergency Medicine

## 2018-08-23 ENCOUNTER — Other Ambulatory Visit: Payer: Self-pay

## 2018-08-23 DIAGNOSIS — M5431 Sciatica, right side: Secondary | ICD-10-CM | POA: Insufficient documentation

## 2018-08-23 DIAGNOSIS — E119 Type 2 diabetes mellitus without complications: Secondary | ICD-10-CM | POA: Insufficient documentation

## 2018-08-23 DIAGNOSIS — Z79899 Other long term (current) drug therapy: Secondary | ICD-10-CM | POA: Insufficient documentation

## 2018-08-23 DIAGNOSIS — I10 Essential (primary) hypertension: Secondary | ICD-10-CM | POA: Insufficient documentation

## 2018-08-23 DIAGNOSIS — Z794 Long term (current) use of insulin: Secondary | ICD-10-CM | POA: Insufficient documentation

## 2018-08-23 MED ORDER — NAPROXEN 500 MG PO TABS: 500.0000 mg | ORAL_TABLET | Freq: Two times a day (BID) | ORAL | 0 refills | Status: DC

## 2018-08-23 MED ORDER — CYCLOBENZAPRINE HCL 10 MG PO TABS: 10.0000 mg | ORAL_TABLET | Freq: Two times a day (BID) | ORAL | 0 refills | Status: DC | PRN

## 2018-08-23 NOTE — ED Provider Notes (Signed)
[facility]-EMERGENCY DEPT Provider Note   CSN: [id number] Arrival date & time: 08/23/18  1005    History   Chief Complaint Chief Complaint  Patient presents with  . Back Pain    HPI Christy Rogers is a 33 y.o. female with hx of DM, HTN and depression who presents to the ED with c/o low back pain. Patient reports that the symptoms started in January and have gotten progressively worse. 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 Patient reports the pain has worsened causing her to miss work.     The history is provided by the patient. No language interpreter was used.  Back Pain  Location:  Lumbar spine Quality:  Shooting and burning Radiates to:  R thigh Pain severity:  Moderate Onset quality:  Gradual Duration:  2 months Timing:  Constant Progression:  Worsening Chronicity:  New Relieved by:  Nothing Worsened by:  Twisting, bending and ambulation Associated symptoms: leg pain   Associated symptoms: no bladder incontinence, no bowel incontinence and no dysuria   Risk factors: not pregnant     Past Medical History:  Diagnosis Date  . Depression   . Diabetes mellitus without complication (HCC)   . Hypertension   . Myocardial infarction [facility])     Patient Active Problem List   Diagnosis Date Noted  . GAD (generalized anxiety disorder) 10/06/2016  . Allergic rhinitis 10/06/2016  . Moderate major depression (HCC) 01/04/2014  . DM type 2 (diabetes mellitus, type 2) (HCC) 05/16/2013  . Insomnia 04/18/2012  . Irregular menses 04/18/2012    Past Surgical History:  Procedure Laterality Date  . APPENDECTOMY     1989     OB History   No obstetric history on file.      Home Medications    Prior to Admission medications   Medication Sig Start Date End Date Taking? Authorizing Provider  amoxicillin (AMOXIL) 500 MG capsule Take 1 capsule (500 mg total) by mouth 3  (three) times daily. 07/20/17   Elson Areas, PA-C  cyclobenzaprine (FLEXERIL) 10 MG tablet Take 1 tablet (10 mg total) by mouth 2 (two) times daily as needed for muscle spasms. 08/23/18   Janne Napoleon, NP  glyBURIDE (DIABETA) 5 MG tablet Take 1 tablet (5 mg total) daily with breakfast by mouth. 04/12/17   Nche, Bonna Gains, NP  insulin glargine (LANTUS) 100 UNIT/ML injection Inject 0.15 mLs (15 Units total) at bedtime into the skin. 04/12/17   Nche, Bonna Gains, NP  Insulin Syringe-Needle U-100 (INSULIN SYRINGE .5CC/31GX5/16") 31G X 5/16" 0.5 ML MISC 1 Units at bedtime by Does not apply route. 04/12/17   Nche, Bonna Gains, NP  lisinopril (PRINIVIL,ZESTRIL) 2.5 MG tablet Take 1 tablet (2.5 mg total) by mouth daily. 12/20/17   Evaristo Bury, NP  metFORMIN (GLUCOPHAGE) 500 MG tablet Take 2 tablets (1,000 mg total) by mouth 2 (two) times daily with a meal. 12/20/17 03/20/18  Evaristo Bury, NP  naproxen (NAPROSYN) 500 MG tablet Take 1 tablet (500 mg total) by mouth 2 (two) times daily. 08/23/18   Janne Napoleon, NP    Family History Family History  Problem Relation Age of Onset  . Diabetes Mother   . Hypertension Father   . Birth defects Maternal Grandfather   . Hypertension Maternal Grandfather   . Hypertension Maternal Grandmother   . Heart disease Paternal Grandmother   . Hypertension Paternal  Grandmother   . Hypertension Paternal Grandfather   . Diabetes Brother   . Cancer Brother     Social History Social History   Tobacco Use  . Smoking status: Never Smoker  . Smokeless tobacco: Never Used  Substance Use Topics  . Alcohol use: Yes    Alcohol/week: 1.0 standard drinks    Types: 1 Cans of beer per week  . Drug use: No     Allergies   Patient has no known allergies.   Review of Systems Review of Systems  Gastrointestinal: Negative for bowel incontinence.  Genitourinary: Negative for bladder incontinence, dysuria, frequency and urgency.  Musculoskeletal:  Positive for arthralgias and back pain.     Physical Exam Updated Vital Signs BP (!) 136/92 (BP Location: Right Arm)   Pulse 98   Temp 98.1 F (36.7 C) (Oral)   Resp 17   Ht 5\' 3"  (1.6 m)   Wt 80.7 kg   LMP 07/09/2018   SpO2 100%   BMI 31.53 kg/m   Physical Exam Vitals signs and nursing note reviewed.  Constitutional:      General: She is not in acute distress.    Appearance: She is well-developed.  HENT:     Head: Normocephalic.     Mouth/Throat:     Mouth: Mucous membranes are moist.  Eyes:     Conjunctiva/sclera: Conjunctivae normal.  Neck:     Musculoskeletal: Neck supple.  Cardiovascular:     Rate and Rhythm: Normal rate.  Pulmonary:     Effort: Pulmonary effort is normal.  Abdominal:     Palpations: Abdomen is soft.     Tenderness: There is no abdominal tenderness.  Musculoskeletal:     Lumbar back: She exhibits tenderness and spasm. She exhibits no deformity, no laceration and normal pulse. Decreased range of motion: due to pain.  Skin:    General: Skin is warm and dry.  Neurological:     Mental Status: She is alert and oriented to person, place, and time.     Sensory: Sensation is intact.     Motor: No weakness.     Gait: Gait normal.     Deep Tendon Reflexes:     Reflex Scores:      Bicep reflexes are 2+ on the right side and 2+ on the left side.      Brachioradialis reflexes are 2+ on the right side and 2+ on the left side.      Patellar reflexes are 2+ on the right side.    Comments: Straight leg raises without difficulty  Psychiatric:        Mood and Affect: Mood normal.      ED Treatments / Results  Labs (all labs ordered are listed, but only abnormal results are displayed) Labs Reviewed - No data to display  Radiology No results found.  Procedures Procedures (including critical care time)  Medications Ordered in ED Medications - No data to display   Initial Impression / Assessment and Plan / ED Course  I have reviewed the triage  vital signs and the nursing notes. Patient with back pain.  No neurological deficits and normal neuro exam.  Patient can walk but states is painful.  No loss of bowel or bladder control.  No concern for cauda equina.  No fever, night sweats, weight loss, h/o cancer, IVDU.  RICE protocol and pain medicine indicated and discussed with patient.   Final Clinical Impressions(s) / ED Diagnoses   Final diagnoses:  Sciatica, right  side    ED Discharge Orders         Ordered    cyclobenzaprine (FLEXERIL) 10 MG tablet  2 times daily PRN     08/23/18 1031    naproxen (NAPROSYN) 500 MG tablet  2 times daily     08/23/18 1031           Damian Leavelleese, Annetta North[place] M, [place]NP 08/23/18 1043    Derwood KaplanNanavati, Ankit, MD 08/24/18 1313

## 2018-08-23 NOTE — ED Triage Notes (Signed)
Pt c/o lower back pains x 2 months that is worse with sitting straight. 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

## 2018-08-23 NOTE — Discharge Instructions (Addendum)
Do not drive while taking the muscle relaxer as it will make you sleepy. Return as needed for worsening symptoms.  °

## 2018-08-23 NOTE — ED Notes (Signed)
ED Provider at bedside. 

## 2018-08-23 NOTE — ED Notes (Signed)
Bed: WTR5 Expected date:  Expected time:  Means of arrival:  Comments: 

## 2018-12-29 ENCOUNTER — Emergency Department (HOSPITAL_COMMUNITY): Admission: EM | Admit: 2018-12-29 | Discharge: 2018-12-29 | Discharge status: Against Medical Advice | Payer: Self-pay

## 2018-12-29 ENCOUNTER — Encounter (HOSPITAL_COMMUNITY): Payer: Self-pay | Admitting: Emergency Medicine

## 2018-12-29 ENCOUNTER — Other Ambulatory Visit: Payer: Self-pay

## 2018-12-29 ENCOUNTER — Emergency Department (HOSPITAL_COMMUNITY)
Admission: EM | Admit: 2018-12-29 | Discharge: 2018-12-30 | Disposition: A | Discharge status: Home / Self Care | Payer: HRSA Program | Attending: Emergency Medicine | Admitting: Emergency Medicine

## 2018-12-29 DIAGNOSIS — B349 Viral infection, unspecified: Secondary | ICD-10-CM

## 2018-12-29 DIAGNOSIS — U071 COVID-19: Secondary | ICD-10-CM | POA: Diagnosis not present

## 2018-12-29 DIAGNOSIS — E119 Type 2 diabetes mellitus without complications: Secondary | ICD-10-CM | POA: Diagnosis not present

## 2018-12-29 DIAGNOSIS — R739 Hyperglycemia, unspecified: Secondary | ICD-10-CM

## 2018-12-29 DIAGNOSIS — Z794 Long term (current) use of insulin: Secondary | ICD-10-CM | POA: Diagnosis not present

## 2018-12-29 DIAGNOSIS — I10 Essential (primary) hypertension: Secondary | ICD-10-CM

## 2018-12-29 DIAGNOSIS — R51 Headache: Secondary | ICD-10-CM | POA: Diagnosis present

## 2018-12-29 DIAGNOSIS — Z79899 Other long term (current) drug therapy: Secondary | ICD-10-CM | POA: Diagnosis not present

## 2018-12-29 DIAGNOSIS — G4489 Other headache syndrome: Secondary | ICD-10-CM

## 2018-12-29 LAB — CBG MONITORING, ED: Glucose-Capillary: 303 mg/dL — ABNORMAL HIGH (ref 70–99)

## 2018-12-29 MED ORDER — METFORMIN HCL 500 MG PO TABS: 500.0000 mg | ORAL_TABLET | Freq: Two times a day (BID) | ORAL | 0 refills | Status: DC

## 2018-12-29 MED ORDER — ACETAMINOPHEN 325 MG PO TABS
650.0000 mg | ORAL_TABLET | Freq: Once | ORAL | Status: AC | PRN
  Administered 2018-12-29: 650 mg via ORAL
  Filled 2018-12-29: qty 2

## 2018-12-29 MED ORDER — LISINOPRIL 2.5 MG PO TABS: 2.5000 mg | ORAL_TABLET | Freq: Every day | ORAL | 0 refills | Status: DC

## 2018-12-29 NOTE — ED Notes (Addendum)
Urine and culture sent to lab  

## 2018-12-29 NOTE — ED Notes (Signed)
Pt called x3 for triage. Pt not in lobby, restroom or outside.

## 2018-12-29 NOTE — ED Notes (Signed)
Registration called pt on phone that is listed in chart, phone going to voicemail. Pt is not in lobby.

## 2018-12-29 NOTE — ED Triage Notes (Addendum)
Patient complaining of headache and left hand tingling/pain. Patient has a fever of 102.0 . Patient states that she has no cough or any other symptoms.

## 2018-12-30 LAB — URINALYSIS, ROUTINE W REFLEX MICROSCOPIC
Bilirubin Urine: NEGATIVE
Glucose, UA: >=500 mg/dL — AB
Hgb urine dipstick: NEGATIVE
Ketones, ur: NEGATIVE mg/dL
Leukocytes,Ua: NEGATIVE
Nitrite: NEGATIVE
Protein, ur: NEGATIVE mg/dL
Specific Gravity, Urine: 1.031 — ABNORMAL HIGH (ref 1.005–1.030)
pH: 6 (ref 5.0–8.0)

## 2018-12-30 LAB — PREGNANCY, URINE: Preg Test, Ur: NEGATIVE

## 2018-12-30 NOTE — ED Notes (Signed)
Discharge instructions verbalized to patient. Pt had no further questions at this time. NAD. 

## 2018-12-30 NOTE — ED Provider Notes (Signed)
[facility] DEPT Provider Note   CSN: [id number] Arrival date & time: 12/29/18  2234     History   Chief Complaint Chief Complaint  Patient presents with  . Headache  . Fever    HPI Christy Rogers is a 33 y.o. female.     The history is provided by the patient.  Headache Pain location:  Generalized Quality:  Dull Radiates to:  Does not radiate Onset quality:  Gradual Duration:  1 day Timing:  Constant Progression:  Worsening Chronicity:  New Relieved by:  None tried Worsened by:  Nothing Associated symptoms: fever and numbness   Associated symptoms: no abdominal pain, no back pain, no congestion, no cough, no diarrhea, no focal weakness, no myalgias, no neck stiffness, no sore throat, no vomiting and no weakness   Associated symptoms comment:  Numbness to bilateral UE  Fever Associated symptoms: headaches   Associated symptoms: no chest pain, no congestion, no cough, no diarrhea, no dysuria, no myalgias, no sore throat and no vomiting   Patient reports starting yesterday she began having a gradual headache.  She was concerned that her blood pressure was elevated.  No focal weakness.  She reports mild numbness of both hands.  Tonight she reports a fever.  No cough or sore throat.  No vomiting or diarrhea.  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 Depression   . Diabetes mellitus without complication ([place])   . Hypertension   . Myocardial infarction [facility])     Patient Active Problem List   Diagnosis Date Noted  . GAD (generalized anxiety disorder) 10/06/2016  . Allergic rhinitis 10/06/2016  . 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 DM type 2 (diabetes mellitus, type 2) ([place]) 05/16/2013  . Insomnia 04/18/2012  . Irregular menses 04/18/2012    Past Surgical History:  Procedure Laterality Date  . APPENDECTOMY     1989     OB History   No  obstetric history on file.      Home Medications    Prior to Admission medications   Medication Sig Start Date End Date Taking? Authorizing Provider  glyBURIDE (DIABETA) 5 MG tablet Take 1 tablet (5 mg total) daily with breakfast by mouth. 04/12/17   Nche, Charlene Brooke, NP  insulin glargine (LANTUS) 100 UNIT/ML injection Inject 0.15 mLs (15 Units total) at bedtime into the skin. 04/12/17   Nche, Charlene Brooke, NP  Insulin Syringe-Needle U-100 (INSULIN SYRINGE .5CC/31GX5/16") 31G X 5/16" 0.5 ML MISC 1 Units at bedtime by Does not apply route. 04/12/17   Nche, Charlene Brooke, NP  lisinopril (ZESTRIL) 2.5 MG tablet Take 1 tablet (2.5 mg total) by mouth daily. 12/29/18   Ripley Fraise, MD  metFORMIN (GLUCOPHAGE) 500 MG tablet Take 1 tablet (500 mg total) by mouth 2 (two) times daily with a meal. 12/29/18   Ripley Fraise, MD    Family History Family History  Problem Relation Age of Onset  . Diabetes Mother   . Hypertension Father   . Birth defects Maternal Grandfather   . Hypertension Maternal Grandfather   . Hypertension Maternal Grandmother   . Heart disease Paternal Grandmother   . Hypertension Paternal Grandmother   . Hypertension Paternal Grandfather   . Diabetes Brother   . Cancer Brother     Social History Social History   Tobacco Use  . Smoking status: Never Smoker  . Smokeless tobacco: Never  Used  Substance Use Topics  . Alcohol use: Yes    Alcohol/week: 1.0 standard drinks    Types: 1 Cans of beer per week  . Drug use: No     Allergies   Patient has no known allergies.   Review of Systems Review of Systems  Constitutional: Positive for fever.  HENT: Negative for congestion and sore throat.   Respiratory: Negative for cough and shortness of breath.   Cardiovascular: Negative for chest pain.  Gastrointestinal: Negative for abdominal pain, diarrhea and vomiting.  Genitourinary: Negative for dysuria.  Musculoskeletal: Negative for back pain, myalgias and  neck stiffness.  Neurological: Positive for numbness and headaches. Negative for focal weakness and weakness.  All other systems reviewed and are negative.    Physical Exam Updated Vital Signs BP (!) 147/92   Pulse 96   Temp (!) 102 F (38.9 C) (Oral)   Resp 18   Ht 1.6 m (5\' 3" )   Wt 79.4 kg   LMP 12/22/2018   SpO2 98%   BMI 31.00 kg/m   Physical Exam CONSTITUTIONAL: Well developed/well nourished HEAD: Normocephalic/atraumatic EYES: EOMI/PERRL, no nystagmus, no ptosis ENMT: Mask in place NECK: supple no meningeal signs LUNGS:  no apparent distress ABDOMEN: soft, nontender, no rebound or guarding GU:no cva tenderness NEURO:Awake/alert, face symmetric, no arm or leg drift is noted Equal 5/5 strength with shoulder abduction, elbow flex/extension, wrist flex/extension in upper extremities Gait normal without ataxia No past pointing Sensation to light touch intact in all extremities EXTREMITIES: pulses normal, full ROM SKIN: warm, color normal PSYCH: no abnormalities of mood noted, alert and oriented to situation    ED Treatments / Results  Labs (all labs ordered are listed, but only abnormal results are displayed) Labs Reviewed  URINALYSIS, ROUTINE W REFLEX MICROSCOPIC - Abnormal; Notable for the following components:      Result Value   Color, Urine STRAW (*)    Specific Gravity, Urine 1.031 (*)    Glucose, UA >=500 (*)    Bacteria, UA RARE (*)    All other components within normal limits  CBG MONITORING, ED - Abnormal; Notable for the following components:   Glucose-Capillary 303 (*)    All other components within normal limits  NOVEL CORONAVIRUS, NAA (HOSPITAL ORDER, SEND-OUT TO REF LAB)  PREGNANCY, URINE    EKG None  Radiology No results found.  Procedures Procedures   Medications Ordered in ED Medications  acetaminophen (TYLENOL) tablet 650 mg (650 mg Oral Given 12/29/18 2340)     Initial Impression / Assessment and Plan / ED Course  I have  reviewed the triage vital signs and the nursing notes.  Pertinent labs results that were available during my care of the patient were reviewed by me and considered in my medical decision making (see chart for details).        Patient presents with headache which she felt was due to high blood pressure.  Blood pressure is mildly elevated.  She does have a fever without any other symptoms She is well-appearing.  No neck stiffness.  No meningeal signs. She denies rash/tick bite No signs of distress. 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 I will restart her on her diabetic and hypertensive medicines, she reports she has PCP follow-up next month She has been out of her medications 12:48 AM Patient reports feeling improved, she is in no acute distress.  No meningeal signs.  Low suspicion for meningitis. She has hyperglycemia, but low  suspicion for DKA She will be discharged home.   [place] Parmar was evaluated in Emergency Department on 12/30/2018 for the symptoms described in the history of present illness. She was evaluated in the context of the global COVID-19 pandemic, which necessitated consideration that the patient might be at risk for infection with the SARS-CoV-2 virus that causes COVID-19. Institutional protocols and algorithms that pertain to the evaluation of patients at risk for COVID-19 are in a state of rapid change based on information released by regulatory bodies including the CDC and federal and state organizations. These policies and algorithms were followed during the patient's care in the ED.  Final Clinical Impressions(s) / ED Diagnoses   Final diagnoses:  Other headache syndrome  Essential hypertension  Viral infection  Hyperglycemia    ED Discharge Orders         Ordered    lisinopril (ZESTRIL) 2.5 MG tablet  Daily     12/29/18 2327    metFORMIN (GLUCOPHAGE) 500 MG tablet  2 times daily with meals     12/29/18 2346            Zadie RhineWickline, Desmond Tufano, MD 12/30/18 [phone]

## 2019-01-01 LAB — NOVEL CORONAVIRUS, NAA (HOSP ORDER, SEND-OUT TO REF LAB; TAT 18-24 HRS): SARS-CoV-2, NAA: DETECTED — AB

## 2019-01-24 ENCOUNTER — Other Ambulatory Visit: Payer: Self-pay

## 2019-01-24 ENCOUNTER — Ambulatory Visit: Payer: Self-pay | Attending: Nurse Practitioner | Admitting: Nurse Practitioner

## 2019-01-24 ENCOUNTER — Encounter: Payer: Self-pay | Admitting: Nurse Practitioner

## 2019-01-24 DIAGNOSIS — E1159 Type 2 diabetes mellitus with other circulatory complications: Secondary | ICD-10-CM

## 2019-01-24 DIAGNOSIS — E782 Mixed hyperlipidemia: Secondary | ICD-10-CM

## 2019-01-24 DIAGNOSIS — E118 Type 2 diabetes mellitus with unspecified complications: Secondary | ICD-10-CM | POA: Insufficient documentation

## 2019-01-24 DIAGNOSIS — Z8659 Personal history of other mental and behavioral disorders: Secondary | ICD-10-CM

## 2019-01-24 DIAGNOSIS — Z794 Long term (current) use of insulin: Secondary | ICD-10-CM

## 2019-01-24 MED ORDER — TRUEPLUS LANCETS 28G MISC: 3 refills | Status: AC

## 2019-01-24 MED ORDER — TRUE METRIX BLOOD GLUCOSE TEST VI STRP: ORAL_STRIP | 12 refills | Status: AC

## 2019-01-24 MED ORDER — ATORVASTATIN CALCIUM 20 MG PO TABS: 20.0000 mg | ORAL_TABLET | Freq: Every day | ORAL | 3 refills | Status: DC

## 2019-01-24 MED ORDER — LISINOPRIL 5 MG PO TABS: 5.0000 mg | ORAL_TABLET | Freq: Every day | ORAL | 1 refills | Status: DC

## 2019-01-24 MED ORDER — BD PEN NEEDLE MINI U/F 31G X 5 MM MISC: 5 refills | Status: DC

## 2019-01-24 MED ORDER — GLYBURIDE 5 MG PO TABS
5.0000 mg | ORAL_TABLET | Freq: Every day | ORAL | 1 refills | Status: DC
Start: 2019-01-24 — End: 2019-02-21

## 2019-01-24 MED ORDER — INSULIN GLARGINE 100 UNIT/ML SOLOSTAR PEN: 20.0000 [IU] | PEN_INJECTOR | Freq: Every day | SUBCUTANEOUS | 11 refills | Status: DC

## 2019-01-24 MED ORDER — TRUE METRIX METER W/DEVICE KIT: PACK | 0 refills | Status: DC

## 2019-01-24 MED ORDER — METFORMIN HCL 500 MG PO TABS: 500.0000 mg | ORAL_TABLET | Freq: Two times a day (BID) | ORAL | 0 refills | Status: DC

## 2019-01-24 MED ORDER — LISINOPRIL 2.5 MG PO TABS: 2.5000 mg | ORAL_TABLET | Freq: Every day | ORAL | 0 refills | Status: DC

## 2019-01-24 MED FILL — TRUEPLUS PEN NDL 31GX3/16: 31G X 5 MM | 100 days supply | Qty: 100 | Fill #0

## 2019-01-24 MED FILL — TRUEplus LANCETS 28G MISC: 50 days supply | Qty: 100 | Fill #0

## 2019-01-24 MED FILL — ATORVASTATIN 20 MG TABLET: 20 | 30 days supply | Qty: 30 | Fill #0

## 2019-01-24 MED FILL — metFORMIN HCL 500 MG TABS: 500 | 30 days supply | Qty: 60 | Fill #0

## 2019-01-24 MED FILL — LISINOPRIL 5 MG TAB: 5 | 30 days supply | Qty: 30 | Fill #0

## 2019-01-24 MED FILL — TRUE METRIX TEST STRIP: 25 days supply | Qty: 100 | Fill #0

## 2019-01-24 MED FILL — !TRUE METRIX BLOOD GLUCOSE: 1 days supply | Qty: 1 | Fill #0

## 2019-01-24 MED FILL — !LANTUS SOLOSTAR 100UNITS/M: 100 | 30 days supply | Qty: 6 | Fill #0

## 2019-01-24 MED FILL — glyBURIDE 5 MG TABS: 5 | 30 days supply | Qty: 30 | Fill #0

## 2019-01-24 NOTE — Progress Notes (Signed)
Virtual Visit via Telephone Note Due to national recommendations of social distancing due to [place] 19, telehealth visit is felt to be most appropriate for this patient at this time.  I discussed the limitations, risks, security and privacy concerns of performing an evaluation and management service by telephone and the availability of in person appointments. I also discussed with the patient that there may be a patient responsible charge related to this service. The patient expressed understanding and agreed to proceed.    I connected with Christy Rogers on 01/24/19  at   3:30 PM EDT  EDT by telephone and verified that I am speaking with the correct person using two identifiers.   Consent I discussed the limitations, risks, security and privacy concerns of performing an evaluation and management service by telephone and the availability of in person appointments. I also discussed with the patient that there may be a patient responsible charge related to this service. The patient expressed understanding and agreed to proceed.   Location of Patient: Private Residence   Location of Provider: [facility] [employer] Office    Persons participating in Telemedicine visit: Geryl Rankins FNP-BC YY Bien CMA Christy R Culp    History of Present Illness: Telemedicine visit for: Establish Care  has a past medical history of Depression (no longer taking lexapro), Diabetes mellitus without complication ([place]), Hypertension, and Myocardial infarction ([place]).   She has not seen a PCP in almost 2 years. Previous PCP was with Vel[facility] and [place] before that. I am unable to verify any records of previous MI that she reports.   DM TYPE 2 Diagnosed 7 years ago. Lowest A1c 6.0. Highest 13.0. She is currently taking metformin 500 mg BID and Diabeta. She has taken 1000 mg of metformin in the past and states it caused her to experience lots of abdominal pain. She is out of lantus at this time.  She normally drinks ginger ale and "lots of sweets". Noncompliant with diet. She has not been monitoring her blood sugars at home. Taking low dose ACE. Will start Statin today.   Lab Results  Component Value Date   HGBA1C 13% 04/12/2017    Mixed Hyperlipidemia Not well controlled. Not at goal <70. Will start atorvastatin.  Lab Results  Component Value Date   LDLCALC 121 (H) 10/06/2016       Past Medical History:  Diagnosis Date  . Depression   . Diabetes mellitus without complication ([place])   . Hypertension   . Myocardial infarction [facility])     Past Surgical History:  Procedure Laterality Date  . APPENDECTOMY     1989    Family History  Problem Relation Age of Onset  . Diabetes Mother   . Hypertension Father   . Birth defects Maternal Grandfather   . Hypertension Maternal Grandfather   . Hypertension Maternal Grandmother   . Heart disease Paternal Grandmother   . Hypertension Paternal Grandmother   . Hypertension Paternal Grandfather   . Diabetes Brother   . Cancer Brother     Social History   Socioeconomic History  . Marital status: Single    Spouse name: Not on file  . Number of children: Not on file  . Years of education: Not on file  . Highest education level: Not on file  Occupational History  . Not on file  Social Needs  . Financial resource strain: Not on file  . Food insecurity    Worry: Not on file    Inability:  Not on file  . Transportation needs    Medical: Not on file    Non-medical: Not on file  Tobacco Use  . Smoking status: Never Smoker  . Smokeless tobacco: Never Used  Substance and Sexual Activity  . Alcohol use: Yes    Alcohol/week: 1.0 standard drinks    Types: 1 Cans of beer per week  . Drug use: No  . Sexual activity: Not Currently    Birth control/protection: Abstinence  Lifestyle  . Physical activity    Days per week: Not on file    Minutes per session: Not on file  . Stress: Not on file  Relationships  . Social  Herbalist on phone: Not on file    Gets together: Not on file    Attends religious service: Not on file    Active member of club or organization: Not on file    Attends meetings of clubs or organizations: Not on file    Relationship status: Not on file  Other Topics Concern  . Not on file  Social History Narrative  . Not on file     Observations/Objective: Awake, alert and oriented x 3   Review of Systems  Constitutional: Negative for fever, malaise/fatigue and weight loss.  HENT: Negative.  Negative for nosebleeds.   Eyes: Negative.  Negative for blurred vision, double vision and photophobia.  Respiratory: Negative.  Negative for cough and shortness of breath.   Cardiovascular: Negative.  Negative for chest pain, palpitations and leg swelling.  Gastrointestinal: Negative.  Negative for heartburn, nausea and vomiting.  Musculoskeletal: Negative.  Negative for myalgias.  Neurological: Negative.  Negative for dizziness, focal weakness, seizures and headaches.  Psychiatric/Behavioral: Negative.  Negative for suicidal ideas.    Assessment and Plan:   Diagnoses and all orders for this visit:  Type 2 diabetes mellitus with complication, with long-term current use of insulin (HCC) -     Insulin Glargine (LANTUS) 100 UNIT/ML Solostar Pen; Inject 20 Units into the skin at bedtime. -     glyBURIDE (DIABETA) 5 MG tablet; Take 1 tablet (5 mg total) by mouth daily with breakfast. -     metFORMIN (GLUCOPHAGE) 500 MG tablet; Take 1 tablet (500 mg total) by mouth 2 (two) times daily with a meal. -     Insulin Pen Needle (B-D UF III MINI PEN NEEDLES) 31G X 5 MM MISC; Use as instructed. Inject into the skin once nightly. -     CBC; Future -     CMP14+EGFR; Future -     Microalbumin / creatinine urine ratio; Future -     Hemoglobin A1c; Future -     glucose blood (TRUE METRIX BLOOD GLUCOSE TEST) test strip; Use as instructed -     Blood Glucose Monitoring Suppl (TRUE METRIX METER)  w/Device KIT; Use as instructed. Check blood glucose level by fingerstick twice per day. -     TRUEplus Lancets 28G MISC; Use as instructed. Check blood glucose level by fingerstick twice per day. -     lisinopril (ZESTRIL) 5 MG tablet; Take 1 tablet (5 mg total) by mouth daily. Continue blood sugar control as discussed in office today, low carbohydrate diet, and regular physical exercise as tolerated, 150 minutes per week (30 min each day, 5 days per week, or 50 min 3 days per week). Keep blood sugar logs with fasting goal of 90-130 mg/dl, post prandial (after you eat) less than 180.  For Hypoglycemia: BS <60 and  Hyperglycemia BS >400; contact the clinic ASAP. Annual eye exams and foot exams are recommended.   Mixed hyperlipidemia -     atorvastatin (LIPITOR) 20 MG tablet; Take 1 tablet (20 mg total) by mouth daily. -     Lipid panel; Future INSTRUCTIONS: Work on a low fat, heart healthy diet and participate in regular aerobic exercise program by working out at least 150 minutes per week; 5 days a week-30 minutes per day. Avoid red meat, fried foods. junk foods, sodas, sugary drinks, unhealthy snacking, alcohol and smoking.  Drink at least 48oz of water per day and monitor your carbohydrate intake daily.    History of depression -     TSH; Future      Follow Up Instructions Return in about 4 weeks (around 02/21/2019) for METER CHECK then 8 weeks PAP.     I discussed the assessment and treatment plan with the patient. The patient was provided an opportunity to ask questions and all were answered. The patient agreed with the plan and demonstrated an understanding of the instructions.   The patient was advised to call back or seek an in-person evaluation if the symptoms worsen or if the condition fails to improve as anticipated.  I provided 23 minutes of non-face-to-face time during this encounter including median intraservice time, reviewing previous notes, labs, imaging, medications and  explaining diagnosis and management.  Gildardo Pounds, FNP-BC

## 2019-01-25 ENCOUNTER — Other Ambulatory Visit: Payer: Self-pay

## 2019-01-25 ENCOUNTER — Ambulatory Visit: Payer: Self-pay | Attending: Family Medicine

## 2019-01-25 DIAGNOSIS — Z8659 Personal history of other mental and behavioral disorders: Secondary | ICD-10-CM

## 2019-01-25 DIAGNOSIS — E118 Type 2 diabetes mellitus with unspecified complications: Secondary | ICD-10-CM

## 2019-01-25 DIAGNOSIS — E782 Mixed hyperlipidemia: Secondary | ICD-10-CM

## 2019-01-26 LAB — HEMOGLOBIN A1C
Est. average glucose Bld gHb Est-mCnc: 326 mg/dL
Hgb A1c MFr Bld: 13 % — ABNORMAL HIGH (ref 4.8–5.6)

## 2019-01-26 LAB — CMP14+EGFR
ALT: 18 IU/L (ref 0–32)
AST: 21 IU/L (ref 0–40)
Albumin/Globulin Ratio: 1.3 (ref 1.2–2.2)
Albumin: 3.9 g/dL (ref 3.8–4.8)
Alkaline Phosphatase: 77 IU/L (ref 39–117)
BUN/Creatinine Ratio: 17 (ref 9–23)
BUN: 12 mg/dL (ref 6–20)
Bilirubin Total: <0.2 mg/dL (ref 0.0–1.2)
CO2: 21 mmol/L (ref 20–29)
Calcium: 9.4 mg/dL (ref 8.7–10.2)
Chloride: 104 mmol/L (ref 96–106)
Creatinine, Ser: 0.7 mg/dL (ref 0.57–1.00)
GFR calc Af Amer: 134 mL/min/{1.73_m2} (ref >59)
GFR calc non Af Amer: 116 mL/min/{1.73_m2} (ref >59)
Globulin, Total: 3 g/dL (ref 1.5–4.5)
Glucose: 200 mg/dL — ABNORMAL HIGH (ref 65–99)
Potassium: 3.9 mmol/L (ref 3.5–5.2)
Sodium: 139 mmol/L (ref 134–144)
Total Protein: 6.9 g/dL (ref 6.0–8.5)

## 2019-01-26 LAB — MICROALBUMIN / CREATININE URINE RATIO
Creatinine, Urine: 174.6 mg/dL
Microalb/Creat Ratio: 63 mg/g creat — ABNORMAL HIGH (ref 0–29)
Microalbumin, Urine: 110.7 ug/mL

## 2019-01-26 LAB — CBC
Hematocrit: 34.4 % (ref 34.0–46.6)
Hemoglobin: 11 g/dL — ABNORMAL LOW (ref 11.1–15.9)
MCH: 22 pg — ABNORMAL LOW (ref 26.6–33.0)
MCHC: 32 g/dL (ref 31.5–35.7)
MCV: 69 fL — ABNORMAL LOW (ref 79–97)
Platelets: 271 10*3/uL (ref 150–450)
RBC: 5 x10E6/uL (ref 3.77–5.28)
RDW: 13.8 % (ref 11.7–15.4)
WBC: 9.3 10*3/uL (ref 3.4–10.8)

## 2019-01-26 LAB — LIPID PANEL
Chol/HDL Ratio: 3.1 ratio (ref 0.0–4.4)
Cholesterol, Total: 140 mg/dL (ref 100–199)
HDL: 45 mg/dL (ref >39)
LDL Calculated: 82 mg/dL (ref 0–99)
Triglycerides: 65 mg/dL (ref 0–149)
VLDL Cholesterol Cal: 13 mg/dL (ref 5–40)

## 2019-01-26 LAB — TSH: TSH: 0.579 u[IU]/mL (ref 0.450–4.500)

## 2019-02-10 MED FILL — !LANTUS SOLOSTAR 100UNITS/M: 100 | 15 days supply | Qty: 3 | Fill #1

## 2019-02-21 ENCOUNTER — Other Ambulatory Visit: Payer: Self-pay

## 2019-02-21 ENCOUNTER — Ambulatory Visit: Payer: Self-pay | Attending: Family Medicine | Admitting: Pharmacist

## 2019-02-21 DIAGNOSIS — E118 Type 2 diabetes mellitus with unspecified complications: Secondary | ICD-10-CM

## 2019-02-21 DIAGNOSIS — Z794 Long term (current) use of insulin: Secondary | ICD-10-CM

## 2019-02-21 LAB — GLUCOSE, POCT (MANUAL RESULT ENTRY): POC Glucose: 289 mg/dl — AB (ref 70–99)

## 2019-02-21 MED ORDER — TRULICITY 0.75 MG/0.5ML ~~LOC~~ SOAJ: 0.7500 mg | SUBCUTANEOUS | 2 refills | Status: DC

## 2019-02-21 MED FILL — TRULICITY 0.75 MG/0.5 ML PE: 0.75 | 55 days supply | Qty: 2 | Fill #0

## 2019-02-21 NOTE — Patient Instructions (Signed)
Thank you for coming to see me today. Please do the following:  1. Stop glyburide. 2. Continue metformin. 3. Continue 20 unitds daily of Lantus.  4. Start Weekly Trulicity.  5. Continue checking blood sugars at home. It's really important that you record these and bring these in to your next doctor's appointment. If you get in readings above 500 or lower than 70, call me or the clinic to let your doctor know. See below on how to treat low blood sugar.  6. Continue making the lifestyle changes we've discussed together during our visit. Diet and exercise play a significant role in improving your blood sugars.  7. Follow-up with me in 2 weeks.    Hypoglycemia or low blood sugar:   Low blood sugar can happen quickly and may become an emergency if not treated right away.   While this shouldn't happen often, it can be brought upon if you skip a meal or do not eat enough. Also, if your insulin or other diabetes medications are dosed too high, this can cause your blood sugar to go to low.   Warning signs of low blood sugar include: 1. Feeling shaky or dizzy 2. Feeling weak or tired  3. Excessive hunger 4. Feeling anxious or upset  5. Sweating even when you aren't exercising  What to do if I experience low blood sugar? 1. Check your blood sugar with your meter. If lower than 70, proceed to step 2.  2. Treat with 3-4 glucose tablets or 3 packets of regular sugar. If these aren't around, you can try hard candy. Yet another option would be to drink 4 ounces of fruit juice or 6 ounces of REGULAR soda.  3. Re-check your sugar in 15 minutes. If it is still below 70, do what you did in step 2 again. If has come back up, go ahead and eat a snack or small meal at this time.

## 2019-02-21 NOTE — Progress Notes (Signed)
    S:    PCP: Zelda  No chief complaint on file.  Patient arrives in good spirits.  Presents for diabetes evaluation, education, and management Patient was referred and last seen by Zelda on 01/24/19.   Patient reports Diabetes was diagnosed in 2013.   Family/Social History:  - FHx: DM (mother, brother), HTN (father, grandparents), heart disease (PGM); negative hx for thyroid cancer - Tobacco: never smoker - Alcohol: denies   Insurance coverage/medication affordability: self pay  Patient denies adherence with medications.  Current diabetes medications include: glyburide 5 mg daily (hasn't started), Lantus 20 units daily, metformin 500 mg BID **unable to tolerate higher metformin dose Current hypertension medications include: lisinopril 5 mg daily Current hyperlipidemia medications include: atorvastatin 20 mg daily  Patient denies hypoglycemic events.  Patient reported dietary habits:  - non-adherent to a diabetic diet but reports that she's working on it  Patient-reported exercise habits:  - has started walking   Patient denies nocturia.  Patient reports neuropathy. Patient denies visual changes. Patient reports self foot exams.    O:  POCT: 289  Lab Results  Component Value Date   HGBA1C 13.0 (H) 01/25/2019   There were no vitals filed for this visit.  Lipid Panel     Component Value Date/Time   CHOL 140 01/25/2019 1411   TRIG 65 01/25/2019 1411   HDL 45 01/25/2019 1411   CHOLHDL 3.1 01/25/2019 1411   CHOLHDL 4 10/06/2016 1012   VLDL 15.4 10/06/2016 1012   LDLCALC 82 01/25/2019 1411   Home fasting CBG: 200 - 250s at home   Clinical ASCVD: No  The ASCVD Risk score Mikey Bussing DC Jr., et al., 2013) failed to calculate for the following reasons:   The 2013 ASCVD risk score is only valid for ages 81 to 33   A/P: Diabetes longstanding currently uncontrolled. Patient is able to verbalize appropriate hypoglycemia management plan. Patient is not adherent with  medication. Control is suboptimal due to dietary indiscretion and physical inactivity. Will stop glyburide and add Trulicity. Pt is to continue Lantus. We cannot increase metformin as she has not tolerated this in the past.  -Continued Lantus 20 units daily.  -Continued metformin 500 mg BID -Start Trulicity 6.76 mg on Mondays. Teaching given. -Stop glyburide.   -Extensively discussed pathophysiology of DM, recommended lifestyle interventions, dietary effects on glycemic control -Counseled on s/sx of and management of hypoglycemia -Next A1C anticipated 04/2019.   ASCVD risk - primary prevention in patient with DM. Last LDL is controlled. ASCVD risk score is not >20%  - moderate intensity statin indicated.  -Continued atorvastatin 20 mg.   HM: UTD on pneumonia and tetanus vaccines. Deferred flu shot until upcoming PCP appt.   Written patient instructions provided.  Total time in face to face counseling 30 minutes.   Follow up Pharmacist Clinic Visit in 2 weeks.     Patient seen with: Dillard Essex PharmD Candidate  Class of 2022 [place], PharmD, [place] [phone]

## 2019-03-07 ENCOUNTER — Ambulatory Visit: Payer: Self-pay | Admitting: Pharmacist

## 2019-03-21 ENCOUNTER — Other Ambulatory Visit: Payer: Self-pay | Admitting: Nurse Practitioner

## 2019-05-16 ENCOUNTER — Other Ambulatory Visit: Payer: Self-pay

## 2019-05-16 ENCOUNTER — Emergency Department (HOSPITAL_COMMUNITY)
Admission: EM | Admit: 2019-05-16 | Discharge: 2019-05-16 | Disposition: A | Discharge status: Home / Self Care | Payer: Self-pay | Attending: Emergency Medicine | Admitting: Emergency Medicine

## 2019-05-16 DIAGNOSIS — Z79899 Other long term (current) drug therapy: Secondary | ICD-10-CM | POA: Insufficient documentation

## 2019-05-16 DIAGNOSIS — Z794 Long term (current) use of insulin: Secondary | ICD-10-CM | POA: Insufficient documentation

## 2019-05-16 DIAGNOSIS — I10 Essential (primary) hypertension: Secondary | ICD-10-CM | POA: Insufficient documentation

## 2019-05-16 DIAGNOSIS — M5441 Lumbago with sciatica, right side: Secondary | ICD-10-CM | POA: Insufficient documentation

## 2019-05-16 DIAGNOSIS — E1165 Type 2 diabetes mellitus with hyperglycemia: Secondary | ICD-10-CM | POA: Insufficient documentation

## 2019-05-16 DIAGNOSIS — R739 Hyperglycemia, unspecified: Secondary | ICD-10-CM

## 2019-05-16 DIAGNOSIS — M5431 Sciatica, right side: Secondary | ICD-10-CM

## 2019-05-16 LAB — CBG MONITORING, ED: Glucose-Capillary: 315 mg/dL — ABNORMAL HIGH (ref 70–99)

## 2019-05-16 MED ORDER — NAPROXEN 500 MG PO TABS: 500.0000 mg | ORAL_TABLET | Freq: Two times a day (BID) | ORAL | 0 refills | Status: DC

## 2019-05-16 MED ORDER — CYCLOBENZAPRINE HCL 10 MG PO TABS: 10.0000 mg | ORAL_TABLET | Freq: Two times a day (BID) | ORAL | 0 refills | Status: DC | PRN

## 2019-05-16 NOTE — ED Provider Notes (Signed)
[facility] DEPT Provider Note   CSN: [id number] Arrival date & time: 05/16/19  1719     History   Chief Complaint Chief Complaint  Patient presents with  . Back Pain    HPI Christy Rogers is a 33 y.o. female.     HPI Patient presents to the emergency room for evaluation of numbness in her right leg.  Patient has a history of having trouble with back pain off and on for almost a year.  Patient states that most of the time the pain is not too bad but occasionally they will be more intense with certain movements for example standing up from a seated position.  Pain will shoot down the right leg occasionally.  Patient has not had any severe pain recently however she started noting some numbness in her right thigh region.  She denies any difficulty with weakness.  No recent falls or injuries.  She does not have any trouble walking.  No fevers, chills or other complaints. Past Medical History:  Diagnosis Date  . Depression   . Diabetes mellitus without complication ([place])   . Hypertension   . Myocardial infarction [facility])     Patient Active Problem List   Diagnosis Date Noted  . Type 2 diabetes mellitus with complication, with long-term current use of insulin ([place]) 01/24/2019  . GAD (generalized anxiety disorder) 10/06/2016  . Allergic rhinitis 10/06/2016  . DM type 2 (diabetes mellitus, type 2) ([place]) 05/16/2013  . Insomnia 04/18/2012  . Irregular menses 04/18/2012    Past Surgical History:  Procedure Laterality Date  . APPENDECTOMY     1989     OB History   No obstetric history on file.      Home Medications    Prior to Admission medications   Medication Sig Start Date End Date Taking? Authorizing Provider  atorvastatin (LIPITOR) 20 MG tablet Take 1 tablet (20 mg total) by mouth daily. 01/24/19   Gildardo Pounds, NP  Blood Glucose Monitoring Suppl (TRUE METRIX METER) w/Device KIT Use as instructed. Check blood glucose level by  fingerstick twice per day. 01/24/19   Gildardo Pounds, NP  cyclobenzaprine (FLEXERIL) 10 MG tablet Take 1 tablet (10 mg total) by mouth 2 (two) times daily as needed for muscle spasms. 05/16/19   Dorie Rank, MD  Dulaglutide (TRULICITY) 7.04 UG/8.9VQ SOPN Inject 0.75 mg into the skin once a week. 02/21/19   Charlott Rakes, MD  glucose blood (TRUE METRIX BLOOD GLUCOSE TEST) test strip Use as instructed 01/24/19   Gildardo Pounds, NP  Insulin Glargine (LANTUS) 100 UNIT/ML Solostar Pen Inject 20 Units into the skin at bedtime. 01/24/19 02/23/19  Gildardo Pounds, NP  Insulin Pen Needle (B-D UF III MINI PEN NEEDLES) 31G X 5 MM MISC Use as instructed. Inject into the skin once nightly. 01/24/19   Gildardo Pounds, NP  lisinopril (ZESTRIL) 5 MG tablet Take 1 tablet (5 mg total) by mouth daily. 01/24/19 04/24/19  Gildardo Pounds, NP  metFORMIN (GLUCOPHAGE) 500 MG tablet Take 1 tablet (500 mg total) by mouth 2 (two) times daily with a meal. 01/24/19   Gildardo Pounds, NP  naproxen (NAPROSYN) 500 MG tablet Take 1 tablet (500 mg total) by mouth 2 (two) times daily. 05/16/19   Dorie Rank, MD  TRUEplus Lancets 28G MISC Use as instructed. Check blood glucose level by fingerstick twice per day. 01/24/19   Gildardo Pounds, NP    Family History Family History  Problem Relation Age of Onset  . Diabetes Mother   . Hypertension Father   . Birth defects Maternal Grandfather   . Hypertension Maternal Grandfather   . Hypertension Maternal Grandmother   . Heart disease Paternal Grandmother   . Hypertension Paternal Grandmother   . Hypertension Paternal Grandfather   . Diabetes Brother   . Cancer Brother     Social History Social History   Tobacco Use  . Smoking status: Never Smoker  . Smokeless tobacco: Never Used  Substance Use Topics  . Alcohol use: Yes    Alcohol/week: 1.0 standard drinks    Types: 1 Cans of beer per week  . Drug use: No     Allergies   Patient has no known allergies.   Review of  Systems Review of Systems  All other systems reviewed and are negative.    Physical Exam Updated Vital Signs BP (!) 141/97 (BP Location: Left Arm)   Pulse 77   Temp 97.7 F (36.5 C) (Oral)   Resp 16   SpO2 99%   Physical Exam Vitals signs and nursing note reviewed.  Constitutional:      General: She is not in acute distress.    Appearance: She is well-developed.  HENT:     Head: Normocephalic and atraumatic.     Right Ear: External ear normal.     Left Ear: External ear normal.  Eyes:     General: No scleral icterus.       Right eye: No discharge.        Left eye: No discharge.     Conjunctiva/sclera: Conjunctivae normal.  Neck:     Musculoskeletal: Neck supple.     Trachea: No tracheal deviation.  Cardiovascular:     Rate and Rhythm: Normal rate and regular rhythm.  Pulmonary:     Effort: Pulmonary effort is normal. No respiratory distress.     Breath sounds: Normal breath sounds. No stridor. No wheezing or rales.  Abdominal:     General: Bowel sounds are normal. There is no distension.     Palpations: Abdomen is soft.     Tenderness: There is no abdominal tenderness. There is no guarding or rebound.  Musculoskeletal:     Lumbar back: She exhibits tenderness.     Comments: Mild paraspinal tenderness on the lower back  Skin:    General: Skin is warm and dry.     Findings: No rash.  Neurological:     Mental Status: She is alert.     Cranial Nerves: No cranial nerve deficit (no facial droop, extraocular movements intact, no slurred speech).     Sensory: Sensory deficit present.     Motor: No abnormal muscle tone or seizure activity.     Coordination: Coordination normal.     Comments: Decreased sensation in the right upper thigh, normal sensation lower leg bilaterally, 5 out of 5 plantar flexion and dorsiflexion bilaterally      ED Treatments / Results  Labs (all labs ordered are listed, but only abnormal results are displayed) Labs Reviewed  CBG  MONITORING, ED - Abnormal; Notable for the following components:      Result Value   Glucose-Capillary 315 (*)    All other components within normal limits    EKG None  Radiology No results found.  Procedures Procedures (including critical care time)  Medications Ordered in ED Medications - No data to display   Initial Impression / Assessment and Plan / ED Course  I have  reviewed the triage vital signs and the nursing notes.  Pertinent labs & imaging results that were available during my care of the patient were reviewed by me and considered in my medical decision making (see chart for details).      Sx are suggestive of sciatica.  Pt does have good strength on exam though.  Will treat with nsaids.  Discussed following up with PCP or spine doctor to consider outpt mri.  No need for emergent intervention based on exam today.  Hyperglycemia noted.  Doubt this would be causing her unilateral numbness.   Encouraged compliance with med regimen and Discussed follow up with her diabetes doctor.      Final Clinical Impressions(s) / ED Diagnoses   Final diagnoses:  Sciatica of right side  Hyperglycemia    ED Discharge Orders         Ordered    naproxen (NAPROSYN) 500 MG tablet  2 times daily     05/16/19 1803    cyclobenzaprine (FLEXERIL) 10 MG tablet  2 times daily PRN     05/16/19 1803           Dorie Rank, MD 05/16/19 1831

## 2019-05-16 NOTE — ED Notes (Signed)
Pt verbalizes understanding of DC instructions. Pt belongings returned and is ambulatory out of ED.  

## 2019-05-16 NOTE — ED Notes (Signed)
Pt states she had a sweet tea right before coming in

## 2019-05-16 NOTE — Discharge Instructions (Addendum)
Take the medications as needed for pain and discomfort, follow up with a primary care doctor or spine doctor to follow up on your leg numbness,  return to the ED if you start having weakness and or fever

## 2019-05-16 NOTE — ED Triage Notes (Signed)
Pt presents with c/o lower back pain that has been going on for ~ year that has progressed to right leg pain. Pt is ambulatory in triage.

## 2020-03-07 ENCOUNTER — Encounter (HOSPITAL_COMMUNITY): Payer: Self-pay

## 2020-03-07 ENCOUNTER — Other Ambulatory Visit: Payer: Self-pay

## 2020-03-07 ENCOUNTER — Emergency Department (HOSPITAL_COMMUNITY)
Admission: EM | Admit: 2020-03-07 | Discharge: 2020-03-07 | Disposition: A | Discharge status: Home / Self Care | Payer: 59 | Attending: Emergency Medicine | Admitting: Emergency Medicine

## 2020-03-07 DIAGNOSIS — E118 Type 2 diabetes mellitus with unspecified complications: Secondary | ICD-10-CM

## 2020-03-07 DIAGNOSIS — E1165 Type 2 diabetes mellitus with hyperglycemia: Secondary | ICD-10-CM | POA: Diagnosis not present

## 2020-03-07 DIAGNOSIS — I10 Essential (primary) hypertension: Secondary | ICD-10-CM | POA: Insufficient documentation

## 2020-03-07 DIAGNOSIS — Z794 Long term (current) use of insulin: Secondary | ICD-10-CM | POA: Insufficient documentation

## 2020-03-07 DIAGNOSIS — Z79899 Other long term (current) drug therapy: Secondary | ICD-10-CM | POA: Diagnosis not present

## 2020-03-07 DIAGNOSIS — E782 Mixed hyperlipidemia: Secondary | ICD-10-CM

## 2020-03-07 DIAGNOSIS — R739 Hyperglycemia, unspecified: Secondary | ICD-10-CM

## 2020-03-07 LAB — BASIC METABOLIC PANEL
Anion gap: 11 (ref 5–15)
BUN: 12 mg/dL (ref 6–20)
CO2: 21 mmol/L — ABNORMAL LOW (ref 22–32)
Calcium: 9.3 mg/dL (ref 8.9–10.3)
Chloride: 101 mmol/L (ref 98–111)
Creatinine, Ser: 0.77 mg/dL (ref 0.44–1.00)
GFR calc Af Amer: >60 mL/min (ref >60)
GFR calc non Af Amer: >60 mL/min (ref >60)
Glucose, Bld: 350 mg/dL — ABNORMAL HIGH (ref 70–99)
Potassium: 4.4 mmol/L (ref 3.5–5.1)
Sodium: 133 mmol/L — ABNORMAL LOW (ref 135–145)

## 2020-03-07 LAB — HCG, QUANTITATIVE, PREGNANCY: hCG, Beta Chain, Quant, S: <1 m[IU]/mL (ref <5)

## 2020-03-07 LAB — CBG MONITORING, ED
Glucose-Capillary: 346 mg/dL — ABNORMAL HIGH (ref 70–99)
Glucose-Capillary: 203 mg/dL — ABNORMAL HIGH (ref 70–99)

## 2020-03-07 LAB — CBC
HCT: 42.8 % (ref 36.0–46.0)
Hemoglobin: 13.9 g/dL (ref 12.0–15.0)
MCH: 22.7 pg — ABNORMAL LOW (ref 26.0–34.0)
MCHC: 32.5 g/dL (ref 30.0–36.0)
MCV: 70 fL — ABNORMAL LOW (ref 80.0–100.0)
Platelets: 272 10*3/uL (ref 150–400)
RBC: 6.11 MIL/uL — ABNORMAL HIGH (ref 3.87–5.11)
RDW: 14 % (ref 11.5–15.5)
WBC: 7.7 10*3/uL (ref 4.0–10.5)
nRBC: 0 % (ref 0.0–0.2)

## 2020-03-07 MED ORDER — SODIUM CHLORIDE 0.9 % IV BOLUS
1000.0000 mL | Freq: Once | INTRAVENOUS | Status: AC
  Administered 2020-03-07: 1000 mL via INTRAVENOUS

## 2020-03-07 MED ORDER — METFORMIN HCL 500 MG PO TABS: 500.0000 mg | ORAL_TABLET | Freq: Two times a day (BID) | ORAL | 1 refills | Status: DC

## 2020-03-07 MED ORDER — LISINOPRIL 5 MG PO TABS: 5.0000 mg | ORAL_TABLET | Freq: Every day | ORAL | 1 refills | Status: DC

## 2020-03-07 MED ORDER — TRULICITY 0.75 MG/0.5ML ~~LOC~~ SOAJ: 0.7500 mg | SUBCUTANEOUS | 0 refills | Status: DC

## 2020-03-07 MED ORDER — ATORVASTATIN CALCIUM 20 MG PO TABS: 20.0000 mg | ORAL_TABLET | Freq: Every day | ORAL | 0 refills | Status: DC

## 2020-03-07 MED ORDER — INSULIN GLARGINE 100 UNIT/ML SOLOSTAR PEN: 20.0000 [IU] | PEN_INJECTOR | Freq: Every day | SUBCUTANEOUS | 0 refills | Status: DC

## 2020-03-07 MED ORDER — INSULIN ASPART 100 UNIT/ML ~~LOC~~ SOLN
6.0000 [IU] | Freq: Once | SUBCUTANEOUS | Status: AC
  Administered 2020-03-07: 6 [IU] via INTRAVENOUS
  Filled 2020-03-07: qty 0.06

## 2020-03-07 NOTE — Discharge Instructions (Addendum)
Refills of your medications have been provided for you today.  You need to obtain a primary care physician for future refills and to assist in controlling your blood sugars.  Return to the emergency department if you experience any new and/or concerning symptoms.

## 2020-03-07 NOTE — ED Provider Notes (Signed)
[facility] DEPT Provider Note   CSN: [id number] Arrival date & time: 03/07/20  0941     History Chief Complaint  Patient presents with  . Hyperglycemia    Christy Rogers is a 34 y.o. female.  Patient is a 34 year old female with past medical history of diabetes and hypertension.  Patient has been off of her insulin and Metformin secondary to insurance reasons for the past year.  Today she felt unwell and was concerned her sugars may be running high.  She denies to me she is having any chest pain, difficulty breathing, fever, vomiting, or diarrhea.  The history is provided by the patient.       Past Medical History:  Diagnosis Date  . Depression   . Diabetes mellitus without complication ([place])   . Hypertension   . Myocardial infarction [facility])     Patient Active Problem List   Diagnosis Date Noted  . Type 2 diabetes mellitus with complication, with long-term current use of insulin ([place]) 01/24/2019  . GAD (generalized anxiety disorder) 10/06/2016  . Allergic rhinitis 10/06/2016  . DM type 2 (diabetes mellitus, type 2) ([name]) 05/16/2013  . Insomnia 04/18/2012  . Irregular menses 04/18/2012    Past Surgical History:  Procedure Laterality Date  . APPENDECTOMY     1989     OB History   No obstetric history on file.     Family History  Problem Relation Age of Onset  . Diabetes Mother   . Hypertension Father   . Birth defects Maternal Grandfather   . Hypertension Maternal Grandfather   . Hypertension Maternal Grandmother   . Heart disease Paternal Grandmother   . Hypertension Paternal Grandmother   . Hypertension Paternal Grandfather   . Diabetes Brother   . Cancer Brother     Social History   Tobacco Use  . Smoking status: Never Smoker  . Smokeless tobacco: Never Used  Vaping Use  . Vaping Use: Never used  Substance Use Topics  . Alcohol use: Yes    Alcohol/week: 1.0 standard drink    Types: 1 Cans of beer per week  .  Drug use: No    Home Medications Prior to Admission medications   Medication Sig Start Date End Date Taking? Authorizing Provider  atorvastatin (LIPITOR) 20 MG tablet Take 1 tablet (20 mg total) by mouth daily. Patient not taking: Reported on 03/07/2020 01/24/19   Gildardo Pounds, NP  Blood Glucose Monitoring Suppl (TRUE METRIX METER) w/Device KIT Use as instructed. Check blood glucose level by fingerstick twice per day. 01/24/19   Gildardo Pounds, NP  cyclobenzaprine (FLEXERIL) 10 MG tablet Take 1 tablet (10 mg total) by mouth 2 (two) times daily as needed for muscle spasms. Patient not taking: Reported on 03/07/2020 05/16/19   Dorie Rank, MD  Dulaglutide (TRULICITY) 0.10 XN/2.3FT SOPN Inject 0.75 mg into the skin once a week. Patient not taking: Reported on 03/07/2020 02/21/19   Charlott Rakes, MD  glucose blood (TRUE METRIX BLOOD GLUCOSE TEST) test strip Use as instructed 01/24/19   Gildardo Pounds, NP  Insulin Glargine (LANTUS) 100 UNIT/ML Solostar Pen Inject 20 Units into the skin at bedtime. Patient not taking: Reported on 03/07/2020 01/24/19 02/23/19  Gildardo Pounds, NP  Insulin Pen Needle (B-D UF III MINI PEN NEEDLES) 31G X 5 MM MISC Use as instructed. Inject into the skin once nightly. 01/24/19   Gildardo Pounds, NP  lisinopril (ZESTRIL) 5 MG tablet Take 1 tablet (5  mg total) by mouth daily. Patient not taking: Reported on 03/07/2020 01/24/19 04/24/19  Gildardo Pounds, NP  metFORMIN (GLUCOPHAGE) 500 MG tablet Take 1 tablet (500 mg total) by mouth 2 (two) times daily with a meal. Patient not taking: Reported on 03/07/2020 01/24/19   Gildardo Pounds, NP  naproxen (NAPROSYN) 500 MG tablet Take 1 tablet (500 mg total) by mouth 2 (two) times daily. Patient not taking: Reported on 03/07/2020 05/16/19   Dorie Rank, MD  TRUEplus Lancets 28G MISC Use as instructed. Check blood glucose level by fingerstick twice per day. 01/24/19   Gildardo Pounds, NP    Allergies    Patient has no known  allergies.  Review of Systems   Review of Systems  All other systems reviewed and are negative.   Physical Exam Updated Vital Signs BP 123/78   Pulse 92   Temp 98.6 F (37 C) (Oral)   Resp 17   Ht $R'5\' 3"'Kt$  (1.6 m)   Wt 74.8 kg   LMP 02/15/2020 (Approximate)   SpO2 99%   BMI 29.23 kg/m   Physical Exam Vitals and nursing note reviewed.  Constitutional:      General: She is not in acute distress.    Appearance: She is well-developed. She is not diaphoretic.  HENT:     Head: Normocephalic and atraumatic.  Cardiovascular:     Rate and Rhythm: Normal rate and regular rhythm.     Heart sounds: No murmur heard.  No friction rub. No gallop.   Pulmonary:     Effort: Pulmonary effort is normal. No respiratory distress.     Breath sounds: Normal breath sounds. No wheezing.  Abdominal:     General: Bowel sounds are normal. There is no distension.     Palpations: Abdomen is soft.     Tenderness: There is no abdominal tenderness.  Musculoskeletal:        General: Normal range of motion.     Cervical back: Normal range of motion and neck supple.  Skin:    General: Skin is warm and dry.  Neurological:     Mental Status: She is alert and oriented to person, place, and time.     ED Results / Procedures / Treatments   Labs (all labs ordered are listed, but only abnormal results are displayed) Labs Reviewed  BASIC METABOLIC PANEL - Abnormal; Notable for the following components:      Result Value   Sodium 133 (*)    CO2 21 (*)    Glucose, Bld 350 (*)    All other components within normal limits  CBC - Abnormal; Notable for the following components:   RBC 6.11 (*)    MCV 70.0 (*)    MCH 22.7 (*)    All other components within normal limits  CBG MONITORING, ED - Abnormal; Notable for the following components:   Glucose-Capillary 346 (*)    All other components within normal limits  CBG MONITORING, ED - Abnormal; Notable for the following components:   Glucose-Capillary 203  (*)    All other components within normal limits  HCG, QUANTITATIVE, PREGNANCY  URINALYSIS, ROUTINE W REFLEX MICROSCOPIC  I-STAT BETA HCG BLOOD, ED ([facility], [facility], [facility] ONLY)    EKG None  Radiology No results found.  Procedures Procedures (including critical care time)  Medications Ordered in ED Medications  sodium chloride 0.9 % bolus 1,000 mL (1,000 mLs Intravenous Bolus from Bag 03/07/20 1142)  insulin aspart (novoLOG) injection 6 Units (6 Units  Intravenous Given 03/07/20 1142)    ED Course  I have reviewed the triage vital signs and the nursing notes.  Pertinent labs & imaging results that were available during my care of the patient were reviewed by me and considered in my medical decision making (see chart for details).    MDM Rules/Calculators/A&P  Patient presenting with elevated blood sugars.  Her initial fingerstick was 350.  Patient given IV fluids and insulin and sugars are improving.  Electrolytes show no evidence for DKA and there are no other abnormalities in her blood work that appears emergent.  Physical examination is benign and the patient appears clinically well.  I will prescribe her diabetes medications and have her make arrangements to follow-up with a primary doctor.  Patient states that she recently acquired health insurance through her new employment and will make it a point to follow-up.  Final Clinical Impression(s) / ED Diagnoses Final diagnoses:  None    Rx / DC Orders ED Discharge Orders    None       Veryl Speak, MD 03/07/20 1307

## 2020-03-07 NOTE — ED Notes (Signed)
An After Visit Summary was printed and given to the patient. Discharge instructions given and no further questions at this time.  

## 2020-03-07 NOTE — ED Triage Notes (Addendum)
Patient states that she has not seen a physician in a year and has not had any insulin either. Patient states she was going to [facility] and missed an appointment and would not see her again.  CBG in triage-346.

## 2020-11-13 ENCOUNTER — Other Ambulatory Visit: Payer: Self-pay

## 2020-11-13 ENCOUNTER — Ambulatory Visit: Payer: Managed Care, Other (non HMO) | Attending: Nurse Practitioner | Admitting: Nurse Practitioner

## 2020-11-13 ENCOUNTER — Encounter: Payer: Self-pay | Admitting: Nurse Practitioner

## 2020-11-13 DIAGNOSIS — E785 Hyperlipidemia, unspecified: Secondary | ICD-10-CM

## 2020-11-13 DIAGNOSIS — E1165 Type 2 diabetes mellitus with hyperglycemia: Secondary | ICD-10-CM | POA: Diagnosis not present

## 2020-11-13 DIAGNOSIS — Z114 Encounter for screening for human immunodeficiency virus [HIV]: Secondary | ICD-10-CM

## 2020-11-13 DIAGNOSIS — Z794 Long term (current) use of insulin: Secondary | ICD-10-CM | POA: Diagnosis not present

## 2020-11-13 DIAGNOSIS — E118 Type 2 diabetes mellitus with unspecified complications: Secondary | ICD-10-CM

## 2020-11-13 DIAGNOSIS — D649 Anemia, unspecified: Secondary | ICD-10-CM

## 2020-11-13 DIAGNOSIS — Z1159 Encounter for screening for other viral diseases: Secondary | ICD-10-CM

## 2020-11-13 MED ORDER — ACCU-CHEK GUIDE ME W/DEVICE KIT
PACK | 0 refills | Status: DC
  Filled 2020-11-13: qty 1, 1d supply, fill #0
  Filled 2021-02-05: qty 1, 30d supply, fill #0

## 2020-11-13 MED ORDER — TRULICITY 0.75 MG/0.5ML ~~LOC~~ SOAJ: 0.7500 mg | SUBCUTANEOUS | 1 refills | Status: AC

## 2020-11-13 MED ORDER — BD PEN NEEDLE MINI U/F 31G X 5 MM MISC
5 refills | Status: DC
  Filled 2020-11-13: qty 100, 34d supply, fill #0
  Filled 2021-02-05 – 2021-02-19 (×2): qty 100, 34d supply, fill #1

## 2020-11-13 MED ORDER — ACCU-CHEK SOFTCLIX LANCETS MISC
3 refills | Status: AC
  Filled 2020-11-13: qty 100, 34d supply, fill #0
  Filled 2021-02-05: qty 100, 30d supply, fill #0

## 2020-11-13 MED ORDER — METFORMIN HCL 500 MG PO TABS: 500.0000 mg | ORAL_TABLET | Freq: Two times a day (BID) | ORAL | 1 refills | Status: DC

## 2020-11-13 MED ORDER — ATORVASTATIN CALCIUM 20 MG PO TABS: 20.0000 mg | ORAL_TABLET | Freq: Every day | ORAL | 1 refills | Status: DC

## 2020-11-13 MED ORDER — LISINOPRIL 5 MG PO TABS
5.0000 mg | ORAL_TABLET | Freq: Every day | ORAL | 1 refills | Status: DC
  Filled 2020-11-13: qty 30, 30d supply, fill #0
  Filled 2021-02-05 – 2021-02-18 (×2): qty 30, 30d supply, fill #1

## 2020-11-13 MED ORDER — MISC. DEVICES MISC
0 refills | Status: DC
  Filled 2020-11-13: qty 1, fill #0

## 2020-11-13 MED ORDER — INSULIN GLARGINE 100 UNIT/ML SOLOSTAR PEN
10.0000 [IU] | PEN_INJECTOR | Freq: Every day | SUBCUTANEOUS | 1 refills | Status: DC
  Filled 2020-11-13: qty 3, 30d supply, fill #0
  Filled 2021-02-05: qty 3, 30d supply, fill #1

## 2020-11-13 MED ORDER — ACCU-CHEK GUIDE VI STRP
ORAL_STRIP | 12 refills | Status: AC
  Filled 2020-11-13: qty 100, 50d supply, fill #0
  Filled 2021-02-05: qty 100, 30d supply, fill #0

## 2020-11-13 NOTE — Progress Notes (Signed)
Virtual Visit via Telephone Note Due to national recommendations of social distancing due to [place] 19, telehealth visit is felt to be most appropriate for this patient at this time.  I discussed the limitations, risks, security and privacy concerns of performing an evaluation and management service by telephone and the availability of in person appointments. I also discussed with the patient that there may be a patient responsible charge related to this service. The patient expressed understanding and agreed to proceed.    I connected with Niger R Duffy on 11/13/20  at   8:50 AM EDT  EDT by telephone and verified that I am speaking with the correct person using two identifiers.  Location of Patient: Private Residence   Location of Provider: [facility] [employer] Office    Persons participating in Telemedicine visit: Geryl Rankins FNP-BC Niger R Bellucci    History of Present Illness: Telemedicine visit for: Re establish care She has a past medical history of Depression, poorly controlled DM 2, Hypertension, anxiety and depression, and Myocardial infarction.  I have not seen her in this office since 2020.  Patient has been counseled on age-appropriate routine health concerns for screening and prevention. These are reviewed and up-to-date. Referrals have been placed accordingly. Immunizations are up-to-date or declined.    PAP: Overdue.  We will schedule for next office visit. Urine microalbumin: Overdue.  We will schedule with upcoming labs.  DM 2 She has been out of all of her medications for over a year.  Will restart Trulicity 2.87 mg weekly, Lantus 10 units nightly and metformin 500 mg twice daily.  We will also restart renal dose ACE and atorvastatin 20 mg daily. Last home reading: 200's last Friday.  She is not monitoring her blood glucose levels daily.   Lab Results  Component Value Date   HGBA1C 13.0 (H) 01/25/2019   Lab Results  Component Value Date   LDLCALC  82 01/25/2019    BP Readings from Last 3 Encounters:  03/07/20 (!) 118/92  05/16/19 (!) 141/97  12/30/18 131/88     Past Medical History:  Diagnosis Date  . Depression   . Diabetes mellitus without complication ([place])   . Hypertension     Past Surgical History:  Procedure Laterality Date  . APPENDECTOMY     1989    Family History  Problem Relation Age of Onset  . Diabetes Mother   . Hypertension Father   . Birth defects Maternal Grandfather   . Hypertension Maternal Grandfather   . Hypertension Maternal Grandmother   . Heart disease Paternal Grandmother   . Hypertension Paternal Grandmother   . Hypertension Paternal Grandfather   . Diabetes Brother   . Cancer Brother   . Kidney cancer Brother     Social History   Socioeconomic History  . Marital status: Single    Spouse name: Not on file  . Number of children: Not on file  . Years of education: Not on file  . Highest education level: Not on file  Occupational History  . Not on file  Tobacco Use  . Smoking status: Never Smoker  . Smokeless tobacco: Never Used  Vaping Use  . Vaping Use: Never used  Substance and Sexual Activity  . Alcohol use: Yes    Alcohol/week: 1.0 standard drink    Types: 1 Cans of beer per week  . Drug use: No  . Sexual activity: Not Currently    Birth control/protection: Abstinence  Other Topics Concern  . Not  on file  Social History Narrative  . Not on file   Social Determinants of Health   Financial Resource Strain: Not on file  Food Insecurity: Not on file  Transportation Needs: Not on file  Physical Activity: Not on file  Stress: Not on file  Social Connections: Not on file     Observations/Objective: Awake, alert and oriented x 3   ROS  Assessment and Plan: Diagnoses and all orders for this visit:  Type 2 diabetes mellitus with complication, with long-term current use of insulin ([facility]) -     Dulaglutide (TRULICITY) 3.08 MV/7.8IO SOPN; Inject 0.75 mg into the skin  once a week. -     insulin glargine (LANTUS) 100 UNIT/ML Solostar Pen; Inject 10 Units into the skin at bedtime. -     Insulin Pen Needle (B-D UF III MINI PEN NEEDLES) 31G X 5 MM MISC; Use as instructed. Inject into the skin once nightly. -     lisinopril (ZESTRIL) 5 MG tablet; Take 1 tablet (5 mg total) by mouth daily. -     metFORMIN (GLUCOPHAGE) 500 MG tablet; Take 1 tablet (500 mg total) by mouth 2 (two) times daily with a meal. -     Accu-Chek Softclix Lancets lancets; Use as instructed. Inject into the skin twice daily E11.65 -     glucose blood (ACCU-CHEK GUIDE) test strip; Use as instructed. Check blood glucose by fingerstick twice per day. E11.65 -     Blood Glucose Monitoring Suppl (ACCU-CHEK GUIDE ME) w/Device KIT; Use as instructed. Check blood glucose level by fingerstick two times per day. E11.65 -     Misc. Devices MISC; Please provide with insurance approved glucometer, strips and lancets. Use as instructed. Check blood glucose level by fingerstick TWICE per day. E11.65 QTY STRIPS 200 QTY LANCETS 200 -     Microalbumin / creatinine urine ratio; Future -     Hemoglobin A1c; Future -     CMP14+EGFR; Future -     Ambulatory referral to Ophthalmology Continue blood sugar control as discussed in office today, low carbohydrate diet, and regular physical exercise as tolerated, 150 minutes per week (30 min each day, 5 days per week, or 50 min 3 days per week). Keep blood sugar logs with fasting goal of 90-130 mg/dl, post prandial (after you eat) less than 180.  For Hypoglycemia: BS <60 and Hyperglycemia BS >400; contact the clinic ASAP. Annual eye exams and foot exams are recommended.    Dyslipidemia, goal LDL below 70 -     Lipid panel; Future -     atorvastatin (LIPITOR) 20 MG tablet; Take 1 tablet (20 mg total) by mouth daily. INSTRUCTIONS: Work on a low fat, heart healthy diet and participate in regular aerobic exercise program by working out at least 150 minutes per week; 5 days  a week-30 minutes per day. Avoid red meat/beef/steak,  fried foods. junk foods, sodas, sugary drinks, unhealthy snacking, alcohol and smoking.  Drink at least 80 oz of water per day and monitor your carbohydrate intake daily.    Need for hepatitis C screening test -     HCV Ab w Reflex to Quant PCR; Future  Encounter for screening for HIV -     HIV antibody (with reflex); Future   Anemia, unspecified type -     CBC; Future     Follow Up Instructions Return in about 4 weeks (around 12/11/2020) for METER CHECK and PAP.     I discussed the assessment and  treatment plan with the patient. The patient was provided an opportunity to ask questions and all were answered. The patient agreed with the plan and demonstrated an understanding of the instructions.   The patient was advised to call back or seek an in-person evaluation if the symptoms worsen or if the condition fails to improve as anticipated.  I provided 14 minutes of non-face-to-face time during this encounter including median intraservice time, reviewing previous notes, labs, imaging, medications and explaining diagnosis and management.  Gildardo Pounds, FNP-BC

## 2020-11-14 ENCOUNTER — Other Ambulatory Visit: Payer: Self-pay

## 2020-11-15 ENCOUNTER — Other Ambulatory Visit: Payer: Self-pay

## 2020-11-20 ENCOUNTER — Other Ambulatory Visit: Payer: Self-pay

## 2021-02-05 ENCOUNTER — Other Ambulatory Visit: Payer: Self-pay | Admitting: Pharmacist

## 2021-02-05 ENCOUNTER — Other Ambulatory Visit: Payer: Self-pay

## 2021-02-05 ENCOUNTER — Telehealth: Payer: Managed Care, Other (non HMO) | Admitting: Physician Assistant

## 2021-02-05 ENCOUNTER — Other Ambulatory Visit: Payer: Self-pay | Admitting: Nurse Practitioner

## 2021-02-05 DIAGNOSIS — R519 Headache, unspecified: Secondary | ICD-10-CM

## 2021-02-05 DIAGNOSIS — E1169 Type 2 diabetes mellitus with other specified complication: Secondary | ICD-10-CM

## 2021-02-05 DIAGNOSIS — I10 Essential (primary) hypertension: Secondary | ICD-10-CM

## 2021-02-05 DIAGNOSIS — E118 Type 2 diabetes mellitus with unspecified complications: Secondary | ICD-10-CM

## 2021-02-05 DIAGNOSIS — Z794 Long term (current) use of insulin: Secondary | ICD-10-CM | POA: Diagnosis not present

## 2021-02-05 MED ORDER — TRUE METRIX METER W/DEVICE KIT
PACK | 0 refills | Status: AC
  Filled 2021-02-05: qty 1, 30d supply, fill #0

## 2021-02-05 MED ORDER — INSULIN GLARGINE-YFGN 100 UNIT/ML ~~LOC~~ SOPN
10.0000 [IU] | PEN_INJECTOR | Freq: Every day | SUBCUTANEOUS | 0 refills | Status: DC
  Filled 2021-02-05 – 2021-02-18 (×2): qty 3, 30d supply, fill #0

## 2021-02-05 NOTE — Telephone Encounter (Signed)
Will forward to Luke  

## 2021-02-05 NOTE — Progress Notes (Signed)
Virtual Visit Consent   Christy Rogers, you are scheduled for a virtual visit with a [facility] provider today.     Just as with appointments in the office, your consent must be obtained to participate.  Your consent will be active for this visit and any virtual visit you may have with one of our providers in the next 365 days.     If you have a MyChart account, a copy of this consent can be sent to you electronically.  All virtual visits are billed to your insurance company just like a traditional visit in the office.    As this is a virtual visit, video technology does not allow for your provider to perform a traditional examination.  This may limit your provider's ability to fully assess your condition.  If your provider identifies any concerns that need to be evaluated in person or the need to arrange testing (such as labs, EKG, etc.), we will make arrangements to do so.     Although advances in technology are sophisticated, we cannot ensure that it will always work on either your end or our end.  If the connection with a video visit is poor, the visit may have to be switched to a telephone visit.  With either a video or telephone visit, we are not always able to ensure that we have a secure connection.     I need to obtain your verbal consent now.   Are you willing to proceed with your visit today?    Christy Rogers has provided verbal consent on 02/05/2021 for a virtual visit (video or telephone).   Leeanne Rio, [place]   Date: 02/05/2021 12:46 PM   Virtual Visit via Video Note   I, Leeanne Rio, connected with  Christy Rogers  ([id number], December 19, 1985) on 02/05/21 at 12:30 PM EDT by a video-enabled telemedicine application and verified that I am speaking with the correct person using two identifiers.  Location: Patient: Virtual Visit Location Patient: Home Provider: Virtual Visit Location Provider: Home Office   I discussed the limitations of evaluation and management by  telemedicine and the availability of in person appointments. The patient expressed understanding and agreed to proceed.    History of Present Illness: Christy Rogers is a 35 y.o. who identifies as a female who was assigned female at birth, and is being seen today for Right sided headache above eye over the past 3 days associated with significant elevations in BP readings. She has history of HTN and uncontrolled DM, overdue for follow-up wit PCP per chart review. She is on lisinopril 5 mg daily for BP and renovascular protection from DM. Has been out of medication for some time. Notes BP today has been more severely elevated with last reading of 150/130 with HR 74. Notes she thinks her sugar is also very high but has not checked in about a month. Notes she cannot check her sugar currently as she ran out of all of her supplies. Denies chest pain, SOB, racing heart, lightheadedness or dizziness. Marland Kitchen   HPI: HPI  Problems:  Patient Active Problem List   Diagnosis Date Noted   Type 2 diabetes mellitus with complication, with long-term current use of insulin ([facility]) 01/24/2019   GAD (generalized anxiety disorder) 10/06/2016   Allergic rhinitis 10/06/2016   DM type 2 (diabetes mellitus, type 2) ([place]) 05/16/2013   Insomnia 04/18/2012   Irregular menses 04/18/2012    Allergies: No Known Allergies Medications:  Current Outpatient  Medications:    Accu-Chek Softclix Lancets lancets, Use as instructed. Inject into the skin twice daily, Disp: 200 each, Rfl: 3   atorvastatin (LIPITOR) 20 MG tablet, Take 1 tablet (20 mg total) by mouth daily., Disp: 90 tablet, Rfl: 1   Blood Glucose Monitoring Suppl (ACCU-CHEK GUIDE ME) w/Device KIT, Use as instructed. Check blood glucose level by fingerstick two times per day., Disp: 1 kit, Rfl: 0   Blood Glucose Monitoring Suppl (TRUE METRIX METER) w/Device KIT, Use as instructed. Check blood glucose level by fingerstick twice per day., Disp: 1 kit, Rfl: 0   Dulaglutide  (TRULICITY) 6.75 FF/6.3WG SOPN, Inject 0.75 mg into the skin once a week., Disp: 6 mL, Rfl: 1   glucose blood (ACCU-CHEK GUIDE) test strip, Use as instructed. Check blood glucose by fingerstick twice per day., Disp: 200 each, Rfl: 12   glucose blood (TRUE METRIX BLOOD GLUCOSE TEST) test strip, Use as instructed, Disp: 100 each, Rfl: 12   insulin glargine-yfgn (SEMGLEE, YFGN,) 100 UNIT/ML Pen, Inject 10 Units into the skin daily., Disp: 15 mL, Rfl: 0   Insulin Pen Needle (B-D UF III MINI PEN NEEDLES) 31G X 5 MM MISC, Use as instructed. Inject into the skin once nightly., Disp: 100 each, Rfl: 5   lisinopril (ZESTRIL) 5 MG tablet, Take 1 tablet (5 mg total) by mouth daily., Disp: 90 tablet, Rfl: 1   metFORMIN (GLUCOPHAGE) 500 MG tablet, Take 1 tablet (500 mg total) by mouth 2 (two) times daily with a meal., Disp: 180 tablet, Rfl: 1   TRUEplus Lancets 28G MISC, Use as instructed. Check blood glucose level by fingerstick twice per day., Disp: 100 each, Rfl: 3  Observations/Objective: Patient is well-developed, well-nourished in no acute distress.  Resting comfortably at home.  Head is normocephalic, atraumatic.  No labored breathing. Speech is clear and coherent with logical content.  Patient is alert and oriented at baseline.   Assessment and Plan: 1. HTN, goal below 130/80  2. Acute nonintractable headache, unspecified headache type  3. Type 2 diabetes mellitus with other specified complication, with long-term current use of insulin (HCC)  Giving severity of BP elevation with ongoing HA and uncontrolled DM she has been triaged to local Urgent Care as she is not wanting to see her PCP. She needs examination, manual BP check and assessment of glucose and renal function. Then adjustments can be made with her HTN regimen and possibly DM regimen once there  is no sign of need for more in-depth evaluation. ER for any worsening symptoms.   Follow Up Instructions: I discussed the assessment and  treatment plan with the patient. The patient was provided an opportunity to ask questions and all were answered. The patient agreed with the plan and demonstrated an understanding of the instructions.  A copy of instructions were sent to the patient via MyChart.  The patient was advised to call back or seek an in-person evaluation if the symptoms worsen or if the condition fails to improve as anticipated.  Time:  I spent 18 minutes with the patient via telehealth technology discussing the above problems/concerns.    Leeanne Rio, PA-C

## 2021-02-05 NOTE — Patient Instructions (Signed)
  Christy R Joss, thank you for joining Leeanne Rio, PA-C for today's virtual visit.  While this provider is not your primary care provider (PCP), if your PCP is located in our provider database this encounter information will be shared with them immediately following your visit.  Consent: (Patient) Christy Rogers provided verbal consent for this virtual visit at the beginning of the encounter.  Current Medications:  Current Outpatient Medications:    Accu-Chek Softclix Lancets lancets, Use as instructed. Inject into the skin twice daily, Disp: 200 each, Rfl: 3   atorvastatin (LIPITOR) 20 MG tablet, Take 1 tablet (20 mg total) by mouth daily., Disp: 90 tablet, Rfl: 1   Blood Glucose Monitoring Suppl (ACCU-CHEK GUIDE ME) w/Device KIT, Use as instructed. Check blood glucose level by fingerstick two times per day., Disp: 1 kit, Rfl: 0   Blood Glucose Monitoring Suppl (TRUE METRIX METER) w/Device KIT, Use as instructed. Check blood glucose level by fingerstick twice per day., Disp: 1 kit, Rfl: 0   Dulaglutide (TRULICITY) 3.82 NK/5.3ZJ SOPN, Inject 0.75 mg into the skin once a week., Disp: 6 mL, Rfl: 1   glucose blood (ACCU-CHEK GUIDE) test strip, Use as instructed. Check blood glucose by fingerstick twice per day., Disp: 200 each, Rfl: 12   glucose blood (TRUE METRIX BLOOD GLUCOSE TEST) test strip, Use as instructed, Disp: 100 each, Rfl: 12   insulin glargine-yfgn (SEMGLEE, YFGN,) 100 UNIT/ML Pen, Inject 10 Units into the skin daily., Disp: 15 mL, Rfl: 0   Insulin Pen Needle (B-D UF III MINI PEN NEEDLES) 31G X 5 MM MISC, Use as instructed. Inject into the skin once nightly., Disp: 100 each, Rfl: 5   lisinopril (ZESTRIL) 5 MG tablet, Take 1 tablet (5 mg total) by mouth daily., Disp: 90 tablet, Rfl: 1   metFORMIN (GLUCOPHAGE) 500 MG tablet, Take 1 tablet (500 mg total) by mouth 2 (two) times daily with a meal., Disp: 180 tablet, Rfl: 1   TRUEplus Lancets 28G MISC, Use as instructed. Check blood  glucose level by fingerstick twice per day., Disp: 100 each, Rfl: 3   Medications ordered in this encounter:  No orders of the defined types were placed in this encounter.    *If you need refills on other medications prior to your next appointment, please contact your pharmacy*  Follow-Up: Call back or seek an in-person evaluation if the symptoms worsen or if the condition fails to improve as anticipated.  Other Instructions Please call your PCP ASAP or be seen at local Urgent Care or ER using the link below   If you have been instructed to have an in-person evaluation today at a local Urgent Care facility, please use the link below. It will take you to a list of all of our available [facility], including address, phone number and hours of operation. Please do not delay care.  [facility]  If you or a family member do not have a primary care provider, use the link below to schedule a visit and establish care. When you choose a [facility] primary care physician or advanced practice provider, you gain a long-term partner in health. Find a Primary Care Provider  Learn more about [facility]'s in-office and virtual care options: [facility]

## 2021-02-12 ENCOUNTER — Other Ambulatory Visit: Payer: Self-pay

## 2021-02-18 ENCOUNTER — Other Ambulatory Visit: Payer: Self-pay

## 2021-02-19 ENCOUNTER — Other Ambulatory Visit: Payer: Self-pay

## 2021-06-10 ENCOUNTER — Ambulatory Visit: Payer: Managed Care, Other (non HMO) | Admitting: Nurse Practitioner

## 2021-06-10 ENCOUNTER — Encounter: Payer: Self-pay | Admitting: Nurse Practitioner

## 2021-06-10 ENCOUNTER — Other Ambulatory Visit (INDEPENDENT_AMBULATORY_CARE_PROVIDER_SITE_OTHER): Payer: Managed Care, Other (non HMO)

## 2021-06-10 ENCOUNTER — Other Ambulatory Visit: Payer: Self-pay

## 2021-06-10 VITALS — BP 126/84 | HR 94 | Temp 97.4°F | Ht 62.5 in | Wt 174.0 lb

## 2021-06-10 DIAGNOSIS — Z794 Long term (current) use of insulin: Secondary | ICD-10-CM

## 2021-06-10 DIAGNOSIS — Z136 Encounter for screening for cardiovascular disorders: Secondary | ICD-10-CM | POA: Diagnosis not present

## 2021-06-10 DIAGNOSIS — E041 Nontoxic single thyroid nodule: Secondary | ICD-10-CM | POA: Diagnosis not present

## 2021-06-10 DIAGNOSIS — E785 Hyperlipidemia, unspecified: Secondary | ICD-10-CM

## 2021-06-10 DIAGNOSIS — E1169 Type 2 diabetes mellitus with other specified complication: Secondary | ICD-10-CM

## 2021-06-10 DIAGNOSIS — Z13220 Encounter for screening for lipoid disorders: Secondary | ICD-10-CM | POA: Diagnosis not present

## 2021-06-10 DIAGNOSIS — Z23 Encounter for immunization: Secondary | ICD-10-CM

## 2021-06-10 DIAGNOSIS — E118 Type 2 diabetes mellitus with unspecified complications: Secondary | ICD-10-CM

## 2021-06-10 LAB — LIPID PANEL
Cholesterol: 192 mg/dL (ref 0–200)
HDL: 47.7 mg/dL (ref >39.00)
NonHDL: 143.94
Total CHOL/HDL Ratio: 4
Triglycerides: 236 mg/dL — ABNORMAL HIGH (ref 0.0–149.0)
VLDL: 47.2 mg/dL — ABNORMAL HIGH (ref 0.0–40.0)

## 2021-06-10 LAB — HEMOGLOBIN A1C: Hgb A1c MFr Bld: 14.6 % — ABNORMAL HIGH (ref 4.6–6.5)

## 2021-06-10 LAB — COMPREHENSIVE METABOLIC PANEL
ALT: 29 U/L (ref 0–35)
AST: 23 U/L (ref 0–37)
Albumin: 4.1 g/dL (ref 3.5–5.2)
Alkaline Phosphatase: 84 U/L (ref 39–117)
BUN: 11 mg/dL (ref 6–23)
CO2: 22 mEq/L (ref 19–32)
Calcium: 9.5 mg/dL (ref 8.4–10.5)
Chloride: 98 mEq/L (ref 96–112)
Creatinine, Ser: 0.76 mg/dL (ref 0.40–1.20)
GFR: 102.37 mL/min (ref >60.00)
Glucose, Bld: 350 mg/dL — ABNORMAL HIGH (ref 70–99)
Potassium: 3.8 mEq/L (ref 3.5–5.1)
Sodium: 130 mEq/L — ABNORMAL LOW (ref 135–145)
Total Bilirubin: 0.4 mg/dL (ref 0.2–1.2)
Total Protein: 8 g/dL (ref 6.0–8.3)

## 2021-06-10 LAB — MICROALBUMIN / CREATININE URINE RATIO
Creatinine,U: 34.7 mg/dL
Microalb Creat Ratio: 14.8 mg/g (ref 0.0–30.0)
Microalb, Ur: 5.1 mg/dL — ABNORMAL HIGH (ref 0.0–1.9)

## 2021-06-10 LAB — TSH: TSH: 0.82 u[IU]/mL (ref 0.35–5.50)

## 2021-06-10 LAB — LDL CHOLESTEROL, DIRECT: Direct LDL: 133 mg/dL

## 2021-06-10 NOTE — Assessment & Plan Note (Signed)
Diagnosed in 2014, uncontrolled due to diet and medication noncompliance. Does not check glucose at home. Reports intermittent paresthesia in right heel, but normal DM foot exam today. Has appt for DM eye exam, requested for report once completed. BP Readings from Last 3 Encounters:  06/10/21 126/84  03/07/20 (!) 118/92  05/16/19 (!) 141/97  no tobacco use No ETOH use No illicit drug use Advised about possible complications from uncontrolled DM. Reviewed necessary dietary modifications, need for medication and lifestyle modification.  Repeat hgbA1c, CMP, lipid panel, and urine microalbumin Entered referral to nutritionist Advised to start glucose check daily in AM F/up in 52month

## 2021-06-10 NOTE — Progress Notes (Signed)
Subjective:  Patient ID: Christy Rogers, female    DOB: 02-22-1986  Age: 36 y.o. MRN: [id number]  CC: Establish Care (New patient/Medication refills needed for DM and HTN. Pt has been out of medications x 4 months. /Diabetic eye exam scheduled for 06/14/21/Pt is not fasting. /Flu vaccine given today)  HPI  Transfer care from Neldon Labella, NP with [facility].  Type 2 diabetes mellitus without complications ([place]) Diagnosed in 2014, uncontrolled due to diet and medication noncompliance. Does not check glucose at home. Reports intermittent paresthesia in right heel, but normal DM foot exam today. Has appt for DM eye exam, requested for report once completed. BP Readings from Last 3 Encounters:  06/10/21 126/84  03/07/20 (!) 118/92  05/16/19 (!) 141/97  no tobacco use No ETOH use No illicit drug use Advised about possible complications from uncontrolled DM. Reviewed necessary dietary modifications, need for medication and lifestyle modification.  Repeat hgbA1c, CMP, lipid panel, and urine microalbumin Entered referral to nutritionist Advised to start glucose check daily in AM F/up in 10month Reviewed past Medical, Social and Family history today.  Outpatient Medications Prior to Visit  Medication Sig Dispense Refill   Accu-Chek Softclix Lancets lancets Use as instructed. Inject into the skin twice daily 200 each 3   Blood Glucose Monitoring Suppl (ACCU-CHEK GUIDE ME) w/Device KIT Use as instructed. Check blood glucose level by fingerstick two times per day. 1 kit 0   Blood Glucose Monitoring Suppl (TRUE METRIX METER) w/Device KIT Use as instructed. Check blood glucose level by fingerstick twice per day. 1 kit 0   glucose blood (ACCU-CHEK GUIDE) test strip Use as instructed. Check blood glucose by fingerstick twice per day. 200 each 12   glucose blood (TRUE METRIX BLOOD GLUCOSE TEST) test strip Use as instructed 100 each 12   insulin glargine-yfgn (SEMGLEE, YFGN,) 100 UNIT/ML Pen  Inject 10 Units into the skin daily. 15 mL 0   Insulin Pen Needle (B-D UF III MINI PEN NEEDLES) 31G X 5 MM MISC Use as instructed. Inject into the skin once nightly. 100 each 5   TRUEplus Lancets 28G MISC Use as instructed. Check blood glucose level by fingerstick twice per day. 100 each 3   lisinopril (ZESTRIL) 2.5 MG tablet Take 1 tablet by mouth daily.     atorvastatin (LIPITOR) 20 MG tablet Take 1 tablet (20 mg total) by mouth daily. 90 tablet 1   lisinopril (ZESTRIL) 5 MG tablet Take 1 tablet (5 mg total) by mouth daily. 90 tablet 1   metFORMIN (GLUCOPHAGE) 500 MG tablet Take 1 tablet (500 mg total) by mouth 2 (two) times daily with a meal. 180 tablet 1   No facility-administered medications prior to visit.    ROS See HPI  Objective:  BP 126/84 (BP Location: Left Arm, Patient Position: Sitting, Cuff Size: Large)    Pulse 94    Temp (!) 97.4 F (36.3 C) (Temporal)    Ht 5' 2.5" (1.588 m)    Wt 174 lb (78.9 kg)    SpO2 98%    BMI 31.32 kg/m   Physical Exam Vitals reviewed.  Neck:     Thyroid: Thyroid mass present. No thyromegaly or thyroid tenderness.  Cardiovascular:     Rate and Rhythm: Normal rate and regular rhythm.     Pulses: Normal pulses.     Heart sounds: Normal heart sounds.  Pulmonary:     Effort: Pulmonary effort is normal.     Breath sounds: Normal breath  sounds.  Musculoskeletal:     Cervical back: Normal range of motion and neck supple.  Lymphadenopathy:     Cervical: No cervical adenopathy.  Neurological:     Mental Status: She is alert and oriented to person, place, and time.    Assessment & Plan:  This visit occurred during the SARS-CoV-2 public health emergency.  Safety protocols were in place, including screening questions prior to the visit, additional usage of staff PPE, and extensive cleaning of exam room while observing appropriate contact time as indicated for disinfecting solutions.   Christy was seen today for establish care.  Diagnoses and all  orders for this visit:  Type 2 diabetes mellitus with other specified complication, with long-term current use of insulin (HCC) -     Hemoglobin A1c -     Microalbumin / creatinine urine ratio -     Comprehensive metabolic panel -     Referral to Nutrition and Diabetes Services  Flu vaccine need -     Flu Vaccine QUAD High Dose(Fluad)  Encounter for lipid screening for cardiovascular disease -     Lipid panel    Problem List Items Addressed This Visit       Endocrine   Type 2 diabetes mellitus without complications ([place]) - Primary    Diagnosed in 2014, uncontrolled due to diet and medication noncompliance. Does not check glucose at home. Reports intermittent paresthesia in right heel, but normal DM foot exam today. Has appt for DM eye exam, requested for report once completed. BP Readings from Last 3 Encounters:  06/10/21 126/84  03/07/20 (!) 118/92  05/16/19 (!) 141/97  no tobacco use No ETOH use No illicit drug use Advised about possible complications from uncontrolled DM. Reviewed necessary dietary modifications, need for medication and lifestyle modification.  Repeat hgbA1c, CMP, lipid panel, and urine microalbumin Entered referral to nutritionist Advised to start glucose check daily in AM F/up in 54month     Other Visit Diagnoses     Flu vaccine need       Relevant Orders   Flu Vaccine QUAD High Dose(Fluad) (Completed)   Encounter for lipid screening for cardiovascular disease       Relevant Orders   Lipid panel       Follow-up: Return in about 4 weeks (around 07/08/2021) for DM.  CWilfred Lacy NP

## 2021-06-10 NOTE — Patient Instructions (Addendum)
Thank you for choosing [facility] primary care. Have eye exam report faxed to me. Go to lab for blood draw and urine collection. Check glucose daily in Am and record. You will be contacted to schedule an appt with nutritionist.  Diabetes Mellitus and Nutrition, Adult When you have diabetes, or diabetes mellitus, it is very important to have healthy eating habits because your blood sugar (glucose) levels are greatly affected by what you eat and drink. Eating healthy foods in the right amounts, at about the same times every day, can help you: Manage your blood glucose. Lower your risk of heart disease. Improve your blood pressure. Reach or maintain a healthy weight. What can affect my meal plan? Every person with diabetes is different, and each person has different needs for a meal plan. Your health care provider may recommend that you work with a dietitian to make a meal plan that is best for you. Your meal plan may vary depending on factors such as: The calories you need. The medicines you take. Your weight. Your blood glucose, blood pressure, and cholesterol levels. Your activity level. Other health conditions you have, such as heart or kidney disease. How do carbohydrates affect me? Carbohydrates, also called carbs, affect your blood glucose level more than any other type of food. Eating carbs raises the amount of glucose in your blood. It is important to know how many carbs you can safely have in each meal. This is different for every person. Your dietitian can help you calculate how many carbs you should have at each meal and for each snack. How does alcohol affect me? Alcohol can cause a decrease in blood glucose (hypoglycemia), especially if you use insulin or take certain diabetes medicines by mouth. Hypoglycemia can be a life-threatening condition. Symptoms of hypoglycemia, such as sleepiness, dizziness, and confusion, are similar to symptoms of having too much alcohol. Do not drink  alcohol if: Your health care provider tells you not to drink. You are pregnant, may be pregnant, or are planning to become pregnant. If you drink alcohol: Limit how much you have to: 0-1 drink a day for women. 0-2 drinks a day for men. Know how much alcohol is in your drink. In the [place] one drink equals one 12 oz bottle of beer (355 mL), one 5 oz glass of wine (148 mL), or one 1 oz glass of hard liquor (44 mL). Keep yourself hydrated with water, diet soda, or unsweetened iced tea. Keep in mind that regular soda, juice, and other mixers may contain a lot of sugar and must be counted as carbs. What are tips for following this plan? Reading food labels Start by checking the serving size on the Nutrition Facts label of packaged foods and drinks. The number of calories and the amount of carbs, fats, and other nutrients listed on the label are based on one serving of the item. Many items contain more than one serving per package. Check the total grams (g) of carbs in one serving. Check the number of grams of saturated fats and trans fats in one serving. Choose foods that have a low amount or none of these fats. Check the number of milligrams (mg) of salt (sodium) in one serving. Most people should limit total sodium intake to less than 2,300 mg per day. Always check the nutrition information of foods labeled as "low-fat" or "nonfat." These foods may be higher in added sugar or refined carbs and should be avoided. Talk to your dietitian to identify your daily  goals for nutrients listed on the label. Shopping Avoid buying canned, pre-made, or processed foods. These foods tend to be high in fat, sodium, and added sugar. Shop around the outside edge of the grocery store. This is where you will most often find fresh fruits and vegetables, bulk grains, fresh meats, and fresh dairy products. Cooking Use low-heat cooking methods, such as baking, instead of high-heat cooking methods, such as deep  frying. Cook using healthy oils, such as olive, canola, or sunflower oil. Avoid cooking with butter, cream, or high-fat meats. Meal planning Eat meals and snacks regularly, preferably at the same times every day. Avoid going long periods of time without eating. Eat foods that are high in fiber, such as fresh fruits, vegetables, beans, and whole grains. Eat 4-6 oz (112-168 g) of lean protein each day, such as lean meat, chicken, fish, eggs, or tofu. One ounce (oz) (28 g) of lean protein is equal to: 1 oz (28 g) of meat, chicken, or fish. 1 egg.  cup (62 g) of tofu. Eat some foods each day that contain healthy fats, such as avocado, nuts, seeds, and fish. What foods should I eat? Fruits Berries. Apples. Oranges. Peaches. Apricots. Plums. Grapes. Mangoes. Papayas. Pomegranates. Kiwi. Cherries. Vegetables Leafy greens, including lettuce, spinach, kale, chard, collard greens, mustard greens, and cabbage. Beets. Cauliflower. Broccoli. Carrots. Green beans. Tomatoes. Peppers. Onions. Cucumbers. Brussels sprouts. Grains Whole grains, such as whole-wheat or whole-grain bread, crackers, tortillas, cereal, and pasta. Unsweetened oatmeal. Quinoa. Brown or wild rice. Meats and other proteins Seafood. Poultry without skin. Lean cuts of poultry and beef. Tofu. Nuts. Seeds. Dairy Low-fat or fat-free dairy products such as milk, yogurt, and cheese. The items listed above may not be a complete list of foods and beverages you can eat and drink. Contact a dietitian for more information. What foods should I avoid? Fruits Fruits canned with syrup. Vegetables Canned vegetables. Frozen vegetables with butter or cream sauce. Grains Refined white flour and flour products such as bread, pasta, snack foods, and cereals. Avoid all processed foods. Meats and other proteins Fatty cuts of meat. Poultry with skin. Breaded or fried meats. Processed meat. Avoid saturated fats. Dairy Full-fat yogurt, cheese, or  milk. Beverages Sweetened drinks, such as soda or iced tea. The items listed above may not be a complete list of foods and beverages you should avoid. Contact a dietitian for more information. Questions to ask a health care provider Do I need to meet with a certified diabetes care and education specialist? Do I need to meet with a dietitian? What number can I call if I have questions? When are the best times to check my blood glucose? Where to find more information: American Diabetes Association: diabetes.org Academy of Nutrition and Dietetics: eatright.[employer] of Diabetes and Digestive and Kidney Diseases: [employer] Association of Diabetes Care & Education Specialists: diabeteseducator.org Summary It is important to have healthy eating habits because your blood sugar (glucose) levels are greatly affected by what you eat and drink. It is important to use alcohol carefully. A healthy meal plan will help you manage your blood glucose and lower your risk of heart disease. Your health care provider may recommend that you work with a dietitian to make a meal plan that is best for you. This information is not intended to replace advice given to you by your health care provider. Make sure you discuss any questions you have with your health care provider. Document Revised: 12/27/2019 Document Reviewed: 12/27/2019 Elsevier Patient Education  [place].

## 2021-06-10 NOTE — Addendum Note (Signed)
Addended by: Varney Biles on: 06/10/2021 02:55 PM   Modules accepted: Orders

## 2021-06-11 ENCOUNTER — Other Ambulatory Visit: Payer: Self-pay

## 2021-06-11 ENCOUNTER — Other Ambulatory Visit (HOSPITAL_COMMUNITY): Payer: Self-pay

## 2021-06-11 MED ORDER — BD PEN NEEDLE MINI U/F 31G X 5 MM MISC
5 refills | Status: AC
  Filled 2021-06-11: qty 100, fill #0

## 2021-06-11 MED ORDER — LEVEMIR FLEXTOUCH 100 UNIT/ML ~~LOC~~ SOPN
15.0000 [IU] | PEN_INJECTOR | Freq: Every day | SUBCUTANEOUS | 1 refills | Status: DC
  Filled 2021-06-11: qty 15, 100d supply, fill #0

## 2021-06-11 MED ORDER — METFORMIN HCL 500 MG PO TABS
500.0000 mg | ORAL_TABLET | Freq: Two times a day (BID) | ORAL | 1 refills | Status: DC
  Filled 2021-06-11: qty 180, 90d supply, fill #0

## 2021-06-11 MED ORDER — ATORVASTATIN CALCIUM 20 MG PO TABS
20.0000 mg | ORAL_TABLET | Freq: Every day | ORAL | 3 refills | Status: DC
  Filled 2021-06-11: qty 90, 90d supply, fill #0

## 2021-06-11 NOTE — Addendum Note (Signed)
Addended by: Alysia Penna L on: 06/11/2021 02:58 PM   Modules accepted: Orders

## 2021-07-09 ENCOUNTER — Other Ambulatory Visit: Payer: Self-pay

## 2021-07-17 ENCOUNTER — Ambulatory Visit: Payer: Managed Care, Other (non HMO) | Admitting: Nurse Practitioner

## 2021-07-22 ENCOUNTER — Emergency Department (HOSPITAL_COMMUNITY)
Admission: EM | Admit: 2021-07-22 | Discharge: 2021-07-22 | Disposition: A | Discharge status: Home / Self Care | Payer: Managed Care, Other (non HMO) | Attending: Student | Admitting: Student

## 2021-07-22 ENCOUNTER — Emergency Department (HOSPITAL_COMMUNITY): Payer: Managed Care, Other (non HMO)

## 2021-07-22 ENCOUNTER — Encounter (HOSPITAL_COMMUNITY): Payer: Self-pay

## 2021-07-22 ENCOUNTER — Other Ambulatory Visit: Payer: Self-pay

## 2021-07-22 DIAGNOSIS — F17290 Nicotine dependence, other tobacco product, uncomplicated: Secondary | ICD-10-CM | POA: Insufficient documentation

## 2021-07-22 DIAGNOSIS — Z79899 Other long term (current) drug therapy: Secondary | ICD-10-CM | POA: Insufficient documentation

## 2021-07-22 DIAGNOSIS — M79602 Pain in left arm: Secondary | ICD-10-CM | POA: Insufficient documentation

## 2021-07-22 DIAGNOSIS — M25512 Pain in left shoulder: Secondary | ICD-10-CM | POA: Insufficient documentation

## 2021-07-22 DIAGNOSIS — E119 Type 2 diabetes mellitus without complications: Secondary | ICD-10-CM | POA: Insufficient documentation

## 2021-07-22 DIAGNOSIS — Z794 Long term (current) use of insulin: Secondary | ICD-10-CM | POA: Insufficient documentation

## 2021-07-22 DIAGNOSIS — Z7984 Long term (current) use of oral hypoglycemic drugs: Secondary | ICD-10-CM | POA: Insufficient documentation

## 2021-07-22 DIAGNOSIS — I10 Essential (primary) hypertension: Secondary | ICD-10-CM | POA: Insufficient documentation

## 2021-07-22 MED ORDER — NAPROXEN 375 MG PO TABS
375.0000 mg | ORAL_TABLET | Freq: Two times a day (BID) | ORAL | 0 refills | Status: AC
Start: 2021-07-22 — End: ?
  Filled 2021-07-22: qty 20, 10d supply, fill #0

## 2021-07-22 MED ORDER — KETOROLAC TROMETHAMINE 15 MG/ML IJ SOLN
15.0000 mg | Freq: Once | INTRAMUSCULAR | Status: AC
  Administered 2021-07-22: 15 mg via INTRAMUSCULAR
  Filled 2021-07-22: qty 1

## 2021-07-22 MED ORDER — LIDOCAINE 5 % EX PTCH
2.0000 | MEDICATED_PATCH | CUTANEOUS | Status: DC
  Administered 2021-07-22: 2 via TRANSDERMAL
  Filled 2021-07-22: qty 2

## 2021-07-22 MED ORDER — LIDOCAINE 4 % EX PTCH
1.0000 | MEDICATED_PATCH | Freq: Two times a day (BID) | CUTANEOUS | 0 refills | Status: DC
  Filled 2021-07-22: qty 15, fill #0

## 2021-07-22 NOTE — ED Provider Triage Note (Signed)
Emergency Medicine Provider Triage Evaluation Note  Christy Rogers , a 36 y.o. female  was evaluated in triage.  Pt complains of left shoulder pain onset 3.5 weeks ago. Patient is left-hand dominant.  Denies recent injury, trauma, heavy lifting.  Has not tried medications for symptoms. Denies fever, chills, color change, swelling.  Review of Systems  Positive: As per HPI above Negative: Fever, chills  Physical Exam  BP (!) 145/104 (BP Location: Right Arm)    Pulse 95    Temp 98.4 F (36.9 C) (Oral)    Resp 18    Ht 5\' 3"  (1.6 m)    Wt 78 kg    LMP 07/17/2021    SpO2 100%    BMI 30.47 kg/m  Gen:   Awake, no distress   Resp:  Normal effort  MSK:   Moves extremities without difficulty  Other:  Tenderness to palpation noted to left trapezius.  No spinal tenderness to palpation.  No tenderness to palpation noted to left clavicle.  Mild tenderness to palpation noted to left bicep tendon.  No tenderness to palpation noted to left olecranon.  Radial pulses intact.  Medical Decision Making  Medically screening exam initiated at 8:59 AM.  Appropriate orders placed.  09/14/2021 R Scharrer was informed that the remainder of the evaluation will be completed by another provider, this initial triage assessment does not replace that evaluation, and the importance of remaining in the ED until their evaluation is complete.    Kariya Lavergne A, PA-C 07/22/21 [phone]

## 2021-07-22 NOTE — ED Triage Notes (Addendum)
Patient c/o intermittent left shoulder pain that extends to the left elbow. Patient states at times it radiates into the left lateral neck and down to the fingers. Patient denies any injury or heavy lifting.  Patient states pain is less when she raises her arm above her head.

## 2021-07-22 NOTE — ED Provider Notes (Signed)
[facility] DEPT Provider Note  CSN: [id number] Arrival date & time: 07/22/21 6213  Chief Complaint(s) Shoulder Pain and Arm Pain  HPI Christy Rogers is a 36 y.o. female with PMH HTN, T2DM who presents emergency department for evaluation of left shoulder pain.  Patient states that she has had the symptoms for approximately 3 weeks and they started to improve but over the last 48 hours got significantly worse.  Her pain is primarily in the tricep on the left as well as the inferior aspect of the scapula on the left.  No known trauma to the area.  She has been taking Tylenol with minimal relief.  Denies chest pain, shortness of breath, abdominal pain, nausea, vomiting or other systemic symptoms.   Shoulder Pain Arm Pain   Past Medical History Past Medical History:  Diagnosis Date   Depression    Diabetes mellitus without complication ([place])    Hypertension    Patient Active Problem List   Diagnosis Date Noted   Thyroid nodule 06/10/2021   GAD (generalized anxiety disorder) 10/06/2016   Allergic rhinitis 10/06/2016   Type 2 diabetes mellitus without complications ([facility]) 08/65/7846   Insomnia 04/18/2012   Irregular menses 04/18/2012   Home Medication(s) Prior to Admission medications   Medication Sig Start Date End Date Taking? Authorizing Provider  Accu-Chek Softclix Lancets lancets Use as instructed. Inject into the skin twice daily 11/13/20   Gildardo Pounds, NP  atorvastatin (LIPITOR) 20 MG tablet Take 1 tablet (20 mg total) by mouth daily. 06/11/21   Nche, Charlene Brooke, NP  Blood Glucose Monitoring Suppl (ACCU-CHEK GUIDE ME) w/Device KIT Use as instructed. Check blood glucose level by fingerstick two times per day. 11/13/20   Gildardo Pounds, NP  Blood Glucose Monitoring Suppl (TRUE METRIX METER) w/Device KIT Use as instructed. Check blood glucose level by fingerstick twice per day. 02/05/21   Charlott Rakes, MD  glucose blood (ACCU-CHEK GUIDE) test  strip Use as instructed. Check blood glucose by fingerstick twice per day. 11/13/20   Gildardo Pounds, NP  glucose blood (TRUE METRIX BLOOD GLUCOSE TEST) test strip Use as instructed 01/24/19   Gildardo Pounds, NP  insulin detemir (LEVEMIR FLEXTOUCH) 100 UNIT/ML FlexPen Inject 15 Units into the skin at bedtime. 06/11/21   Nche, Charlene Brooke, NP  Insulin Pen Needle (B-D UF III MINI PEN NEEDLES) 31G X 5 MM MISC Use as instructed. Inject into the skin once nightly. 06/11/21   Nche, Charlene Brooke, NP  lisinopril (ZESTRIL) 5 MG tablet Take 1 tablet (5 mg total) by mouth daily. 11/13/20 02/11/21  Gildardo Pounds, NP  metFORMIN (GLUCOPHAGE) 500 MG tablet Take 1 tablet (500 mg total) by mouth 2 (two) times daily with a meal. 06/11/21   Nche, Charlene Brooke, NP  TRUEplus Lancets 28G MISC Use as instructed. Check blood glucose level by fingerstick twice per day. 01/24/19   Gildardo Pounds, NP  Past Surgical History Past Surgical History:  Procedure Laterality Date   APPENDECTOMY     1989   Family History Family History  Problem Relation Age of Onset   Diabetes Mother    Hypertension Father    Birth defects Maternal Grandfather    Hypertension Maternal Grandfather    Hypertension Maternal Grandmother    Heart disease Paternal Grandmother    Hypertension Paternal Grandmother    Hypertension Paternal Grandfather    Diabetes Brother    Cancer Brother    Kidney cancer Brother     Social History Social History   Tobacco Use   Smoking status: Some Days    Types: Cigars   Smokeless tobacco: Never  Vaping Use   Vaping Use: Never used  Substance Use Topics   Alcohol use: Not Currently   Drug use: No   Allergies Patient has no known allergies.  Review of Systems Review of Systems  Musculoskeletal:  Positive for arthralgias and myalgias.   Physical Exam Vital Signs  I  have reviewed the triage vital signs BP (!) 145/104 (BP Location: Right Arm)    Pulse 95    Temp 98.4 F (36.9 C) (Oral)    Resp 18    Ht 5' 3" (1.6 m)    Wt 78 kg    LMP 07/14/2021    SpO2 100%    BMI 30.47 kg/m   Physical Exam Vitals and nursing note reviewed.  Constitutional:      General: She is not in acute distress.    Appearance: She is well-developed.  HENT:     Head: Normocephalic and atraumatic.  Eyes:     Conjunctiva/sclera: Conjunctivae normal.  Cardiovascular:     Rate and Rhythm: Normal rate and regular rhythm.     Heart sounds: No murmur heard. Pulmonary:     Effort: Pulmonary effort is normal. No respiratory distress.     Breath sounds: Normal breath sounds.  Abdominal:     Palpations: Abdomen is soft.     Tenderness: There is no abdominal tenderness.  Musculoskeletal:        General: Tenderness (L trapezius, L tricep) present. No swelling.     Cervical back: Neck supple.  Skin:    General: Skin is warm and dry.     Capillary Refill: Capillary refill takes less than 2 seconds.  Neurological:     Mental Status: She is alert.  Psychiatric:        Mood and Affect: Mood normal.    ED Results and Treatments Labs (all labs ordered are listed, but only abnormal results are displayed) Labs Reviewed - No data to display                                                                                                                        Radiology DG Shoulder Left  Result Date: 07/22/2021 CLINICAL DATA:  Intermittent LEFT shoulder pain radiating to LEFT hand EXAM: LEFT SHOULDER - 2+ VIEW COMPARISON:  None FINDINGS: Osseous mineralization normal. AC joint alignment normal. No fracture, dislocation, or bone destruction. Visualized ribs intact. IMPRESSION: No acute abnormalities. Electronically Signed   By: Lavonia Dana M.D.   On: 07/22/2021 09:51   DG Humerus Left  Result Date: 07/22/2021 CLINICAL DATA:  Intermittent LEFT shoulder pain radiating to LEFT hand EXAM:  LEFT HUMERUS - 2+ VIEW COMPARISON:  None FINDINGS: Osseous mineralization normal. Joint spaces preserved. No fracture, dislocation, or bone destruction. IMPRESSION: Normal exam. Electronically Signed   By: Lavonia Dana M.D.   On: 07/22/2021 09:52    Pertinent labs & imaging results that were available during my care of the patient were reviewed by me and considered in my medical decision making (see MDM for details).  Medications Ordered in ED Medications  lidocaine (LIDODERM) 5 % 2 patch (2 patches Transdermal Patch Applied 07/22/21 1146)  ketorolac (TORADOL) 15 MG/ML injection 15 mg (15 mg Intramuscular Given 07/22/21 1147)                                                                                                                                     Procedures Procedures  (including critical care time)  Medical Decision Making / ED Course   This patient presents to the ED for concern of shoulder and arm pain, this involves an extensive number of treatment options, and is a complaint that carries with it a high risk of complications and morbidity.  The differential diagnosis includes muscular strain, contusion, ligamentous injury, rotator cuff pathology  MDM: Patient seen in the emergency department for evaluation of shoulder and arm pain.  Physical exam reveals tenderness over the tricep on the left and the inferior aspect of the trapezius on the left.  Neurologic exam unremarkable.  No midline C-spine tenderness.  X-ray imaging unremarkable.  Patient given lidocaine patch and Toradol and on reevaluation symptoms improved.  She will be discharged with a prescription for Naprosyn and lidocaine patches as well as follow-up with outpatient orthopedics.  Suspect musculoskeletal strain.   Additional history obtained:  -External records from outside source obtained and reviewed including: Chart review including previous notes, labs, imaging, consultation notes   Lab Tests: -I ordered,  reviewed, and interpreted labs.   The pertinent results include:   Labs Reviewed - No data to display      Imaging Studies ordered: I ordered imaging studies including XR shoulder, XR humerus I independently visualized and interpreted imaging. I agree with the radiologist interpretation   Medicines ordered and prescription drug management: Meds ordered this encounter  Medications   ketorolac (TORADOL) 15 MG/ML injection 15 mg   lidocaine (LIDODERM) 5 % 2 patch    -I have reviewed the patients home medicines and have made adjustments as needed  Critical interventions none  Cardiac Monitoring: The patient was maintained on a cardiac monitor.  I personally viewed and interpreted the cardiac monitored which showed an underlying rhythm of:  NSR  Social Determinants of Health:  Factors impacting patients care include: none   Reevaluation: After the interventions noted above, I reevaluated the patient and found that they have :improved  Co morbidities that complicate the patient evaluation  Past Medical History:  Diagnosis Date   Depression    Diabetes mellitus without complication ([place])    Hypertension       Dispostion: I considered admission for this patient, but her musculoskeletal strain does not meet inpatient criteria and she is safe for discharge with outpatient follow-up.     Final Clinical Impression(s) / ED Diagnoses Final diagnoses:  None     _0 @    Teressa Lower, MD 07/22/21 1159

## 2021-07-29 ENCOUNTER — Other Ambulatory Visit: Payer: Self-pay

## 2021-08-08 ENCOUNTER — Ambulatory Visit: Payer: Managed Care, Other (non HMO) | Admitting: Dietician

## 2021-08-14 ENCOUNTER — Ambulatory Visit: Payer: Managed Care, Other (non HMO) | Admitting: Nurse Practitioner

## 2021-12-11 NOTE — Progress Notes (Signed)
Established Patient Office Visit  Subjective   Patient ID: Christy Rogers, female    DOB: July 02, 1985  Age: 36 y.o. MRN: [id number]  Chief Complaint  Patient presents with   Follow-up    6 mo f/u DM, HTN    HPI  Christy Rogers is here to follow-up on hypertension and diabetes. She has been out of her medications for the past few months. She was taking metformin 500 BID, levemir insulin 15 units daily. She last checked her blood sugar 2 weeks ago and it was 305. She did not start her cholesterol medication as suggested last visit. She states her glucometer broke. She was taking lisinopril 29m daily for kidney protection. She has been out of this medication for the past few months.  She denies chest pain, shortness of breath, numbness and tingling in her feet.  She has been having left upper quadrant discomfort for about a week. She describes it as a soreness. She has not tried anything over the counter. The soreness comes and goes. She feels like she might have slept wrong or pulled something.   Past Medical History:  Diagnosis Date   Depression    Diabetes mellitus without complication ([facility]    Hypertension    Past Surgical History:  Procedure Laterality Date   APPENDECTOMY     1989      ROS See pertinent positives and negatives per HPI.    Objective:     BP 124/86 (BP Location: Right Arm, Cuff Size: Normal)   Pulse 92   Temp 97.9 F (36.6 C) (Temporal)   Wt 167 lb 3.2 oz (75.8 kg)   SpO2 99%   BMI 29.62 kg/m    Physical Exam Vitals and nursing note reviewed.  Constitutional:      General: She is not in acute distress.    Appearance: Normal appearance.  HENT:     Head: Normocephalic.  Eyes:     Conjunctiva/sclera: Conjunctivae normal.  Cardiovascular:     Rate and Rhythm: Normal rate and regular rhythm.     Pulses: Normal pulses.     Heart sounds: Normal heart sounds.  Pulmonary:     Effort: Pulmonary effort is normal.     Breath sounds: Normal breath  sounds.  Abdominal:     Palpations: Abdomen is soft.     Tenderness: There is abdominal tenderness (slight) in the left upper quadrant. There is no guarding. Negative signs include Murphy's sign, Rovsing's sign and McBurney's sign.  Musculoskeletal:     Cervical back: Normal range of motion.  Skin:    General: Skin is warm.  Neurological:     General: No focal deficit present.     Mental Status: She is alert and oriented to person, place, and time.  Psychiatric:        Mood and Affect: Mood normal.        Behavior: Behavior normal.        Thought Content: Thought content normal.        Judgment: Judgment normal.      Assessment & Plan:   Problem List Items Addressed This Visit       Endocrine   Type 2 diabetes mellitus without complications ([facility]    She has been out of her medication for the past few months.  She states that when she last checked her blood sugar 2 weeks ago it was 305.  She states that her glucometer is currently broken.  We will  refill her metformin 500 mg twice daily, Levemir 15 units daily, and lisinopril 5 mg daily for kidney protection.  We will also send a prescription for new glucometer.  Encouraged her to check her blood sugar at least daily.  Discussed going to an eye doctor.  We will check CMP, A1c.  Encouraged her to take her medications regularly get her blood sugars under control to prevent complications.  Follow-up with PCP in 3 months.      Relevant Medications   atorvastatin (LIPITOR) 20 MG tablet   Blood Glucose Monitoring Suppl (ACCU-CHEK GUIDE) w/Device KIT   insulin detemir (LEVEMIR FLEXTOUCH) 100 UNIT/ML FlexPen   lisinopril (ZESTRIL) 5 MG tablet   metFORMIN (GLUCOPHAGE) 500 MG tablet   Other Relevant Orders   Hemoglobin A1c   Comprehensive metabolic panel     Other   Left upper quadrant abdominal pain    No red flags on exam.  She states that it feels like a discomfort in her left upper quadrant like she pulled a muscle.  She  can take Tylenol as needed for pain.  We will check CMP today.      Other Visit Diagnoses     Dyslipidemia, goal LDL below 70       She did not start her atorvastatin as prescribed last visit.  Discussed that I recommend starting this daily.  Refill sent to the pharmacy.   Relevant Medications   atorvastatin (LIPITOR) 20 MG tablet   lisinopril (ZESTRIL) 5 MG tablet       Return in about 3 months (around 03/14/2022) for Diabetes with [place].    Lauren A McElwee, NP  

## 2021-12-12 ENCOUNTER — Encounter: Payer: Self-pay | Admitting: Nurse Practitioner

## 2021-12-12 ENCOUNTER — Other Ambulatory Visit: Payer: Self-pay

## 2021-12-12 ENCOUNTER — Telehealth: Payer: Self-pay | Admitting: Nurse Practitioner

## 2021-12-12 ENCOUNTER — Ambulatory Visit (INDEPENDENT_AMBULATORY_CARE_PROVIDER_SITE_OTHER): Payer: Self-pay | Admitting: Nurse Practitioner

## 2021-12-12 VITALS — BP 124/86 | HR 92 | Temp 97.9°F | Wt 167.2 lb

## 2021-12-12 DIAGNOSIS — R1012 Left upper quadrant pain: Secondary | ICD-10-CM

## 2021-12-12 DIAGNOSIS — E119 Type 2 diabetes mellitus without complications: Secondary | ICD-10-CM

## 2021-12-12 DIAGNOSIS — E785 Hyperlipidemia, unspecified: Secondary | ICD-10-CM

## 2021-12-12 DIAGNOSIS — Z794 Long term (current) use of insulin: Secondary | ICD-10-CM

## 2021-12-12 LAB — COMPREHENSIVE METABOLIC PANEL
ALT: 24 U/L (ref 0–35)
AST: 17 U/L (ref 0–37)
Albumin: 4.2 g/dL (ref 3.5–5.2)
Alkaline Phosphatase: 75 U/L (ref 39–117)
BUN: 12 mg/dL (ref 6–23)
CO2: 24 mEq/L (ref 19–32)
Calcium: 9.3 mg/dL (ref 8.4–10.5)
Chloride: 101 mEq/L (ref 96–112)
Creatinine, Ser: 0.69 mg/dL (ref 0.40–1.20)
GFR: 112.97 mL/min (ref >60.00)
Glucose, Bld: 325 mg/dL — ABNORMAL HIGH (ref 70–99)
Potassium: 4 mEq/L (ref 3.5–5.1)
Sodium: 132 mEq/L — ABNORMAL LOW (ref 135–145)
Total Bilirubin: 0.4 mg/dL (ref 0.2–1.2)
Total Protein: 7.9 g/dL (ref 6.0–8.3)

## 2021-12-12 LAB — HEMOGLOBIN A1C: Hgb A1c MFr Bld: 13.5 % — ABNORMAL HIGH (ref 4.6–6.5)

## 2021-12-12 MED ORDER — LISINOPRIL 5 MG PO TABS
5.0000 mg | ORAL_TABLET | Freq: Every day | ORAL | 0 refills | Status: AC
Start: 2021-12-12 — End: 2022-03-12
  Filled 2021-12-12: qty 90, 90d supply, fill #0

## 2021-12-12 MED ORDER — METFORMIN HCL 500 MG PO TABS
500.0000 mg | ORAL_TABLET | Freq: Two times a day (BID) | ORAL | 0 refills | Status: AC
  Filled 2021-12-12: qty 180, 90d supply, fill #0

## 2021-12-12 MED ORDER — ATORVASTATIN CALCIUM 20 MG PO TABS
20.0000 mg | ORAL_TABLET | Freq: Every day | ORAL | 0 refills | Status: AC
Start: 2021-12-12 — End: ?
  Filled 2021-12-12: qty 90, 90d supply, fill #0

## 2021-12-12 MED ORDER — LEVEMIR FLEXTOUCH 100 UNIT/ML ~~LOC~~ SOPN
15.0000 [IU] | PEN_INJECTOR | Freq: Every day | SUBCUTANEOUS | 0 refills | Status: AC
  Filled 2021-12-12: qty 15, 100d supply, fill #0

## 2021-12-12 MED ORDER — ACCU-CHEK GUIDE W/DEVICE KIT
PACK | 0 refills | Status: AC
Start: 2021-12-12 — End: ?
  Filled 2021-12-12: qty 1, 30d supply, fill #0

## 2021-12-12 MED ORDER — BASAGLAR KWIKPEN 100 UNIT/ML ~~LOC~~ SOPN
15.0000 [IU] | PEN_INJECTOR | Freq: Every day | SUBCUTANEOUS | 1 refills | Status: AC
  Filled 2021-12-12: qty 6, 40d supply, fill #0

## 2021-12-12 NOTE — Assessment & Plan Note (Signed)
She has been out of her medication for the past few months.  She states that when she last checked her blood sugar 2 weeks ago it was 305.  She states that her glucometer is currently broken.  We will refill her metformin 500 mg twice daily, Levemir 15 units daily, and lisinopril 5 mg daily for kidney protection.  We will also send a prescription for new glucometer.  Encouraged her to check her blood sugar at least daily.  Discussed going to an eye doctor.  We will check CMP, A1c.  Encouraged her to take her medications regularly get her blood sugars under control to prevent complications.  Follow-up with PCP in 3 months.

## 2021-12-12 NOTE — Telephone Encounter (Signed)
Pharmacy called and said that the med prescribed  Levimere is going to cost the pt over $200 due to no ins at this time. The pharmacist said she can give her Basaglar at no charge. Can it be changed? Please advise.

## 2021-12-12 NOTE — Telephone Encounter (Signed)
Rx sent 

## 2021-12-12 NOTE — Patient Instructions (Signed)
It was great to see you!  Restart your insulin 15 units daily, metformin 1 pill twice a day, lisinopril 1 pill daily, and atorvastatin 1 pill daily. I have reordered a glucometer to check your blood sugars.   Let's follow-up in 3 months, sooner if you have concerns.  If a referral was placed today, you will be contacted for an appointment. Please note that routine referrals can sometimes take up to 3-4 weeks to process. Please call our office if you haven't heard anything after this time frame.  Take care,  Rodman Pickle, NP

## 2021-12-12 NOTE — Assessment & Plan Note (Signed)
No red flags on exam.  She states that it feels like a discomfort in her left upper quadrant like she pulled a muscle.  She can take Tylenol as needed for pain.  We will check CMP today.

## 2022-01-02 ENCOUNTER — Other Ambulatory Visit (HOSPITAL_COMMUNITY): Payer: Self-pay

## 2022-03-18 ENCOUNTER — Ambulatory Visit: Payer: Self-pay | Admitting: Nurse Practitioner

## 2022-03-18 ENCOUNTER — Telehealth: Payer: Self-pay | Admitting: Nurse Practitioner

## 2022-03-18 NOTE — Telephone Encounter (Signed)
Pt was a no show for an OV with Nche on 10/11. This is her second, letter has been sent

## 2022-03-20 ENCOUNTER — Ambulatory Visit: Payer: Self-pay | Admitting: Nurse Practitioner

## 2022-03-25 NOTE — Telephone Encounter (Signed)
No show 07/17/21 & 03/18/22  Generated $50 no show fee for 10/11 Final warning letter sent via mail & MyChart

## 2023-08-23 ENCOUNTER — Telehealth: Payer: Self-pay | Admitting: Nurse Practitioner

## 2023-08-23 NOTE — Telephone Encounter (Signed)
 See note

## 2023-09-01 IMAGING — CR DG SHOULDER 2+V*L*
3 series · 3 of 3 positions shown · non-contrast
Comparison: None

CLINICAL DATA: Intermittent LEFT shoulder pain radiating to LEFT
hand

EXAM:
LEFT SHOULDER - 2+ VIEW

[w shoulder external left]
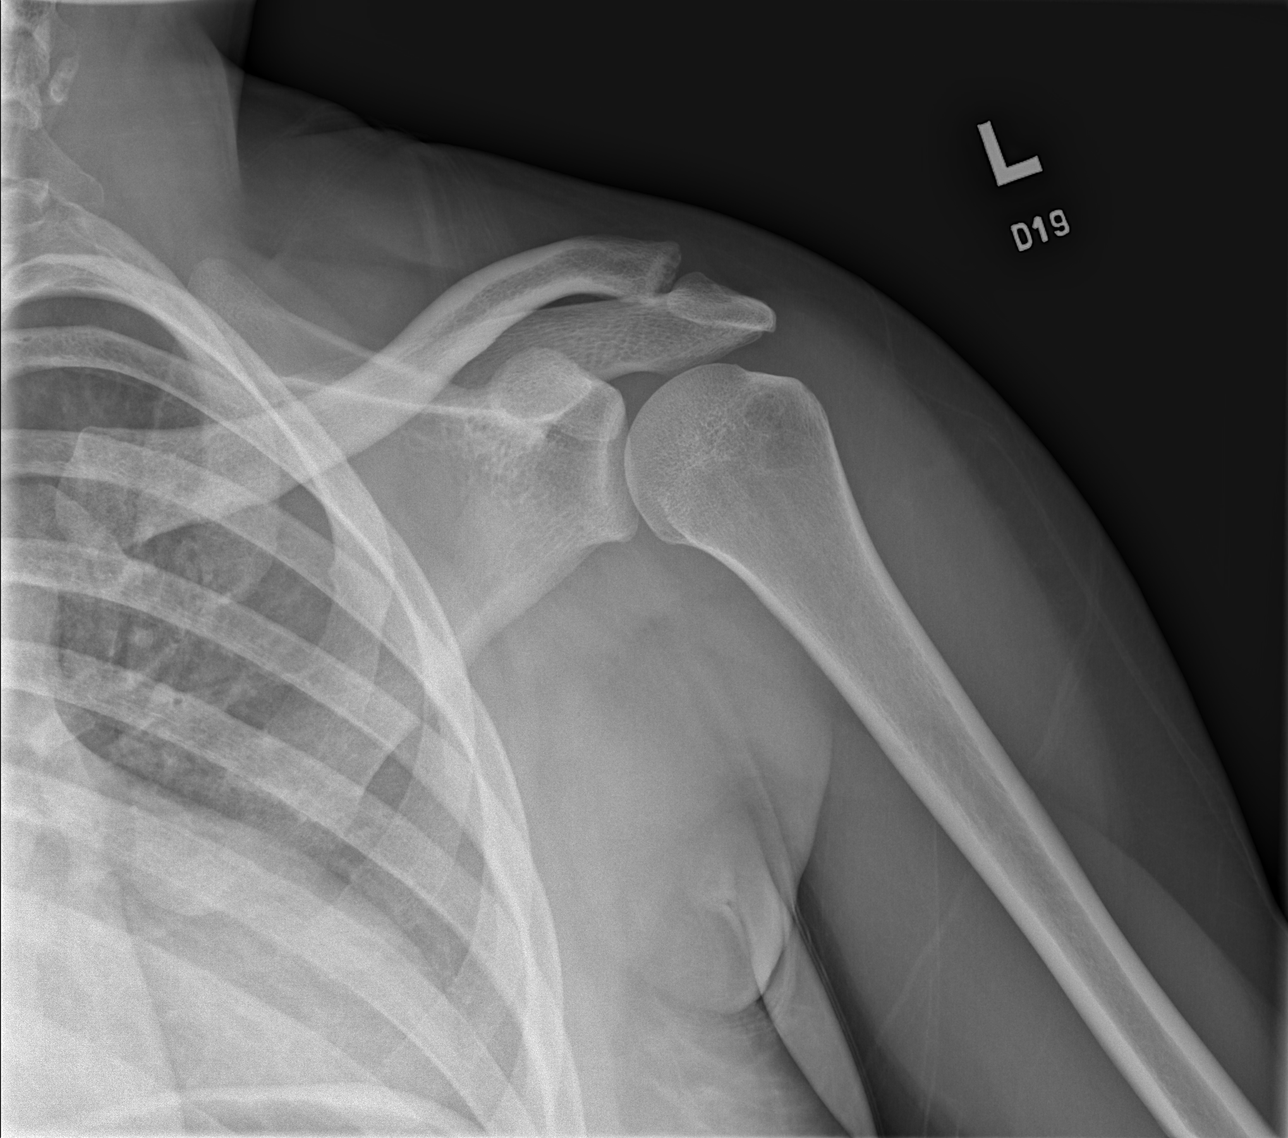

[w shoulder y-view left]
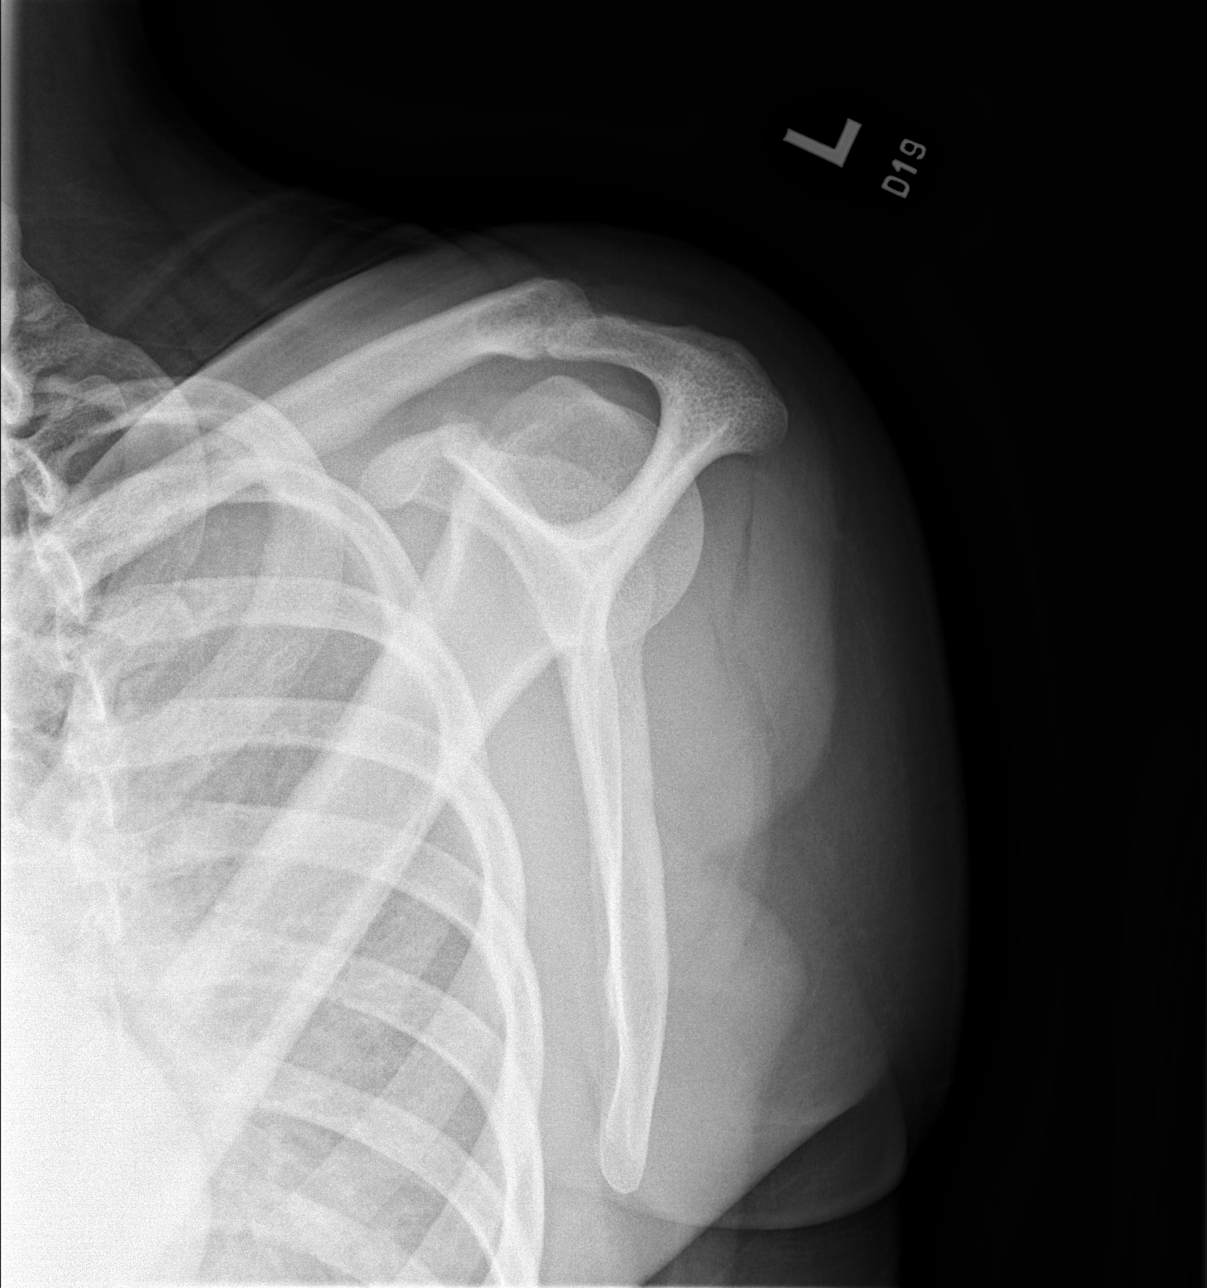

[x shoulder axillary left]
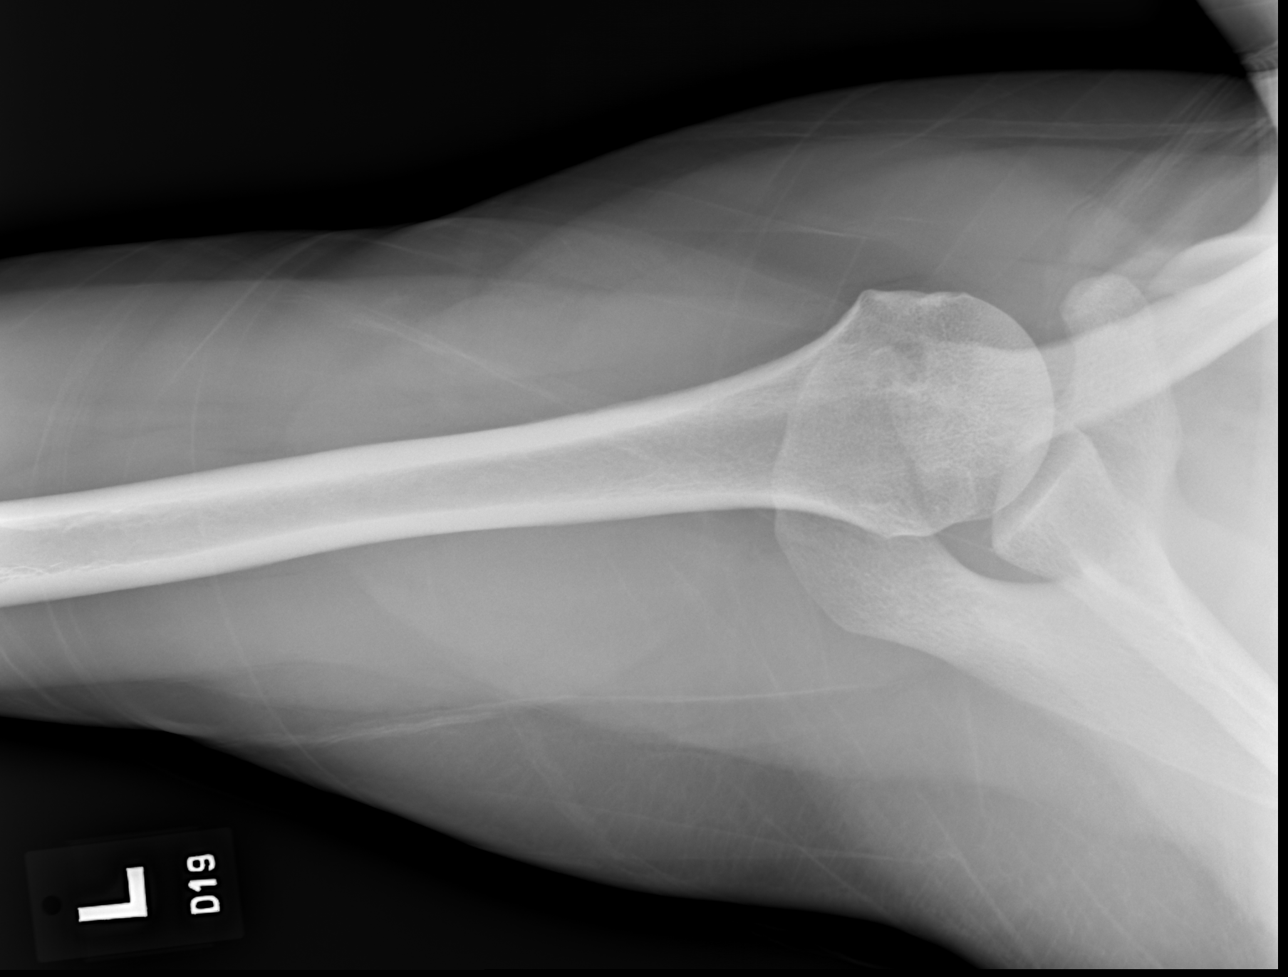

[3 of 3 positions shown; findings below may reference images not displayed]

FINDINGS: Osseous mineralization normal.

AC joint alignment normal.

No fracture, dislocation, or bone destruction.

Visualized ribs intact.
IMPRESSION: No acute abnormalities.

## 2023-09-14 ENCOUNTER — Ambulatory Visit: Payer: Self-pay | Admitting: Nurse Practitioner

## 2023-09-21 ENCOUNTER — Telehealth: Payer: Self-pay | Admitting: Nurse Practitioner

## 2023-09-21 NOTE — Telephone Encounter (Signed)
 09/14/2023 pt showed up at 9.53am for her 9.20am appt, 1st missed appt since 2023, letter sent via mychart.  Pt did not reschedule.

## 2023-10-01 ENCOUNTER — Telehealth: Payer: Self-pay | Admitting: Nurse Practitioner

## 2023-10-01 NOTE — Telephone Encounter (Signed)
 Lvmtcb if needing appt.
